# Patient Record
Sex: Female | Born: 2000 | Race: Black or African American | Hispanic: No | Marital: Single | Smoking: Never smoker
Health system: Southern US, Community
[De-identification: ages and names within clinical notes are randomized; demographics above are authoritative.]

## PROBLEM LIST (undated history)

## (undated) DIAGNOSIS — O24419 Gestational diabetes mellitus in pregnancy, unspecified control: Secondary | ICD-10-CM

---
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
## 2021-08-26 ENCOUNTER — Other Ambulatory Visit: Payer: Self-pay

## 2021-08-26 ENCOUNTER — Ambulatory Visit (INDEPENDENT_AMBULATORY_CARE_PROVIDER_SITE_OTHER): Payer: 59

## 2021-08-26 VITALS — BP 122/80 | HR 85 | Ht 66.25 in | Wt 233.5 lb

## 2021-08-26 DIAGNOSIS — Z3480 Encounter for supervision of other normal pregnancy, unspecified trimester: Secondary | ICD-10-CM | POA: Insufficient documentation

## 2021-08-26 DIAGNOSIS — Z3401 Encounter for supervision of normal first pregnancy, first trimester: Secondary | ICD-10-CM | POA: Diagnosis not present

## 2021-08-26 DIAGNOSIS — Z3A01 Less than 8 weeks gestation of pregnancy: Secondary | ICD-10-CM | POA: Diagnosis not present

## 2021-08-26 NOTE — Progress Notes (Signed)
Patient was assessed and managed by nursing staff during this encounter. I have reviewed the chart and agree with the documentation and plan. I have also made any necessary editorial changes.  Catalina Antigua, MD 08/26/2021 11:14 AM

## 2021-08-26 NOTE — Progress Notes (Addendum)
New OB Intake  I connected with  Janice Kemp on 08/26/21 at  8:15 AM EDT by in person. Video Visit and verified that I am speaking with the correct person using two identifiers. Nurse is located at [facility] and pt is located at [place].  I discussed the limitations, risks, security and privacy concerns of performing an evaluation and management service by telephone and the availability of in person appointments. I also discussed with the patient that there may be a patient responsible charge related to this service. The patient expressed understanding and agreed to proceed.  I explained I am completing New OB Intake today. We discussed her EDD of 04/23/22 that is based on early u/s. Pt is G1/P0. I reviewed her allergies, medications, Medical/Surgical/OB history, and appropriate screenings. I informed her of [facility] services. Based on history, this is a/an  pregnancy uncomplicated .   There are no problems to display for this patient.   Concerns addressed today  Delivery Plans:  Plans to deliver at [facility].   MyChart/Babyscripts MyChart access verified. I explained pt will have some visits in office and some virtually. Babyscripts instructions given and order placed. Patient verifies receipt of registration text/e-mail. Account successfully created and app downloaded.  Blood Pressure Cuff  Patient has private insurance; instructed to purchase blood pressure cuff and bring to first prenatal appt. Explained after first prenatal appt pt will check weekly and document in Babyscripts.  Weight scale: Patient    have weight scale. Weight scale ordered for patient to pick up form [facility].   Anatomy US Explained first scheduled [place] will be around 19 weeks. Dating and viability scan performed today.  Labs Discussed Avelina Laine genetic screening with patient. Would like both Panorama and Horizon drawn at new OB visit. Routine prenatal labs needed.  Covid Vaccine Patient has not covid vaccine.    Mother/ Baby Dyad Candidate?    If yes, offer as possibility  Informed patient of [facility] website  and placed link in her AVS.   Social Determinants of Health Food Insecurity: Patient denies food insecurity. WIC Referral: Patient is interested in referral to [facility].  Transportation: Patient denies transportation needs. Childcare: Discussed no children allowed at ultrasound appointments. Offered childcare services; patient declines childcare services at this time.  Send link to Pregnancy Navigators   Placed OB Box on problem list and updated  First visit review I reviewed new OB appt with pt. I explained she will have a pelvic exam, ob bloodwork with genetic screening, and PAP smear. Explained pt will be seen by Gerrit Heck at first visit; encounter routed to appropriate provider. Explained that patient will be seen by pregnancy navigator following visit with provider. [facility] information placed in AVS.   Hamilton Capri, RN 08/26/2021  8:27 AM

## 2021-09-05 ENCOUNTER — Other Ambulatory Visit: Payer: 59

## 2021-09-05 ENCOUNTER — Ambulatory Visit (INDEPENDENT_AMBULATORY_CARE_PROVIDER_SITE_OTHER): Payer: 59

## 2021-09-05 ENCOUNTER — Other Ambulatory Visit: Payer: Self-pay

## 2021-09-05 DIAGNOSIS — Z3401 Encounter for supervision of normal first pregnancy, first trimester: Secondary | ICD-10-CM

## 2021-09-05 DIAGNOSIS — Z3A01 Less than 8 weeks gestation of pregnancy: Secondary | ICD-10-CM

## 2021-09-05 DIAGNOSIS — O3680X0 Pregnancy with inconclusive fetal viability, not applicable or unspecified: Secondary | ICD-10-CM

## 2021-09-05 NOTE — Progress Notes (Signed)
DATING AND VIABILITY SONOGRAM   Janice Kemp is a 20 y.o. year old G1P0 with LMP Patient's last menstrual period was 06/30/2021. which would correlate to  [redacted]w[redacted]d weeks gestation.  She has regular menstrual cycles.   She is here today for a confirmatory initial sonogram.    GESTATION: SINGLETON Pregnancy     FETAL ACTIVITY:          Heart rate         147          The fetus is inactive.   GESTATIONAL AGE AND  BIOMETRICS:  Gestational criteria: Estimated Date of Delivery: 04/23/22 by early ultrasound now at [redacted]w[redacted]d  Previous Scans:1                                                  AVERAGE EGA(BY THIS SCAN):  7 weeks  WORKING EDD( early ultrasound ):  04/23/22     TECHNICIAN COMMENTS:  Single live IUP at [redacted]w[redacted]d. Reassessment of fetal heart rate due to fetal bradycardia at last scan.   A copy of this report including all images has been saved and backed up to a second source for retrieval if needed. All measures and details of the anatomical scan, placentation, fluid volume and pelvic anatomy are contained in that report.  Raliyah Montella J Jamesa Tedrick 09/05/2021 8:27 AM

## 2021-09-24 ENCOUNTER — Encounter: Payer: Self-pay | Admitting: Advanced Practice Midwife

## 2021-09-26 ENCOUNTER — Ambulatory Visit: Payer: Self-pay | Admitting: Obstetrics and Gynecology

## 2021-10-10 ENCOUNTER — Ambulatory Visit (INDEPENDENT_AMBULATORY_CARE_PROVIDER_SITE_OTHER): Payer: 59

## 2021-10-10 ENCOUNTER — Other Ambulatory Visit (HOSPITAL_COMMUNITY)
Admission: RE | Admit: 2021-10-10 | Discharge: 2021-10-10 | Disposition: A | Discharge status: Home / Self Care | Payer: 59 | Source: Ambulatory Visit

## 2021-10-10 ENCOUNTER — Other Ambulatory Visit: Payer: Self-pay

## 2021-10-10 VITALS — BP 130/81 | HR 90 | Wt 239.4 lb

## 2021-10-10 DIAGNOSIS — O99210 Obesity complicating pregnancy, unspecified trimester: Secondary | ICD-10-CM | POA: Diagnosis not present

## 2021-10-10 DIAGNOSIS — Z3401 Encounter for supervision of normal first pregnancy, first trimester: Secondary | ICD-10-CM

## 2021-10-10 DIAGNOSIS — Z124 Encounter for screening for malignant neoplasm of cervix: Secondary | ICD-10-CM

## 2021-10-10 DIAGNOSIS — Z3A12 12 weeks gestation of pregnancy: Secondary | ICD-10-CM | POA: Diagnosis not present

## 2021-10-10 MED ORDER — ASPIRIN EC 81 MG PO TBEC: 81.0000 mg | DELAYED_RELEASE_TABLET | Freq: Every day | ORAL | 4 refills | Status: DC

## 2021-10-10 NOTE — Progress Notes (Signed)
Subjective:   Janice Kemp is a 20 y.o. G1P0 at [redacted]w[redacted]d by 5 week early ultrasound being seen today for her first obstetrical visit.  Patient states this was an unplanned pregnancy.  Patient reports she not on birth control prior to conception, but had her Nexplanon removed in Feb 2022.  Gynecological/Obstetrical History: Patient reports no history of gynecological surgeries.  Patient has had a pap and denies history of abnormal pap smears.   Pregnancy history fully reviewed. Patient does intend to breast feed. Patient obstetrical history is significant for obesity.   Sexual Activity and Vaginal Concerns: Patient is currently sexually active and no pain or discomfort during intercourse.  She also denies vaginal bleeding, irritation, or odor. She reports some clear vaginal discharge, but denies issues. Patient also denies pain or difficulty with urination.    Medical History/ROS: Patient denies medical history significant for cardiovascular, respiratory, gastrointestinal, or hematological disorders. Patient also denies history of MH disorders including anxiety and/or depression.   Patient reports no complaints.  Patient reports constipation and reports last bm was yesterday that was not difficult to pass.  She denies diarrhea or nausea/vomiting.  No recurrent headaches, but reports occasional HA.    Social History: Patient report history of alcohol usage which subsided after discovery of pregnancy.  She denies current usage of tobacco, alcohol, or drugs.  Patient reports the FOB is George Ina who is present and supportive.  Patient reports that she lives with Loraine Leriche and endorses safety at home.  Patient denies DV/A. Patient is currently employed at [employer]. She reports she wears a mask while working.  HISTORY: OB History  Gravida Para Term Preterm AB Living  1 0 0 0 0 0  SAB IAB Ectopic Multiple Live Births  0 0 0 0 0    # Outcome Date GA Lbr Len/2nd Weight Sex Delivery Anes PTL  Lv  1 Current             Last pap smear was done today and is pending.  No past medical history on file. Past Surgical History:  Procedure Laterality Date   TONSILECTOMY/ADENOIDECTOMY WITH MYRINGOTOMY  2005   Family History  Problem Relation Age of Onset   Hypertension Mother    Hypertension Maternal Grandmother    Social History   Tobacco Use   Smoking status: Never   Smokeless tobacco: Never  Vaping Use   Vaping Use: Never used  Substance Use Topics   Alcohol use: Not Currently    Comment: not since confirmed pregnancy   Drug use: Not Currently    Types: Marijuana    Comment: not since confirmed pregnancy   Not on File Current Outpatient Medications on File Prior to Visit  Medication Sig Dispense Refill   Prenatal Vit-Fe Fumarate-FA (PRENATAL VITAMINS PO) Take by mouth.     No current facility-administered medications on file prior to visit.    Review of Systems Pertinent items noted in HPI and remainder of comprehensive ROS otherwise negative.  Exam   Vitals:   10/10/21 0820  BP: 130/81  Pulse: 90  Weight: 239 lb 6.4 oz (108.6 kg)      Physical Exam Vitals reviewed. Exam conducted with a chaperone present.  Constitutional:      Appearance: Normal appearance. She is obese.  HENT:     Head: Normocephalic and atraumatic.  Eyes:     Conjunctiva/sclera: Conjunctivae normal.  Cardiovascular:     Rate and Rhythm: Normal rate and regular rhythm.  Pulses: Normal pulses.     Heart sounds: Normal heart sounds.  Pulmonary:     Effort: Pulmonary effort is normal. No respiratory distress.     Breath sounds: Normal breath sounds.  Abdominal:     General: Bowel sounds are normal.     Palpations: Abdomen is soft.  Genitourinary:    General: Normal vulva.     Comments: Speculum Exam: -Normal External Genitalia: Non tender, no apparent discharge at introitus.  -Vaginal Vault: Pink mucosa with good rugae. Scant amt discharge   -Cervix:Pink, no lesions,  cysts, or polyps.  Appears closed. No active bleeding from os- Pap collected with brush and spatula -Bimanual Exam:  Uterine size/position difficult to appreciate d/t body habitus.  No tenderness.    Musculoskeletal:        General: Normal range of motion.     Cervical back: Normal range of motion.  Skin:    General: Skin is warm and dry.  Neurological:     Mental Status: She is alert and oriented to person, place, and time.  Psychiatric:        Mood and Affect: Mood normal.        Behavior: Behavior normal.        Thought Content: Thought content normal.     Assessment:   19 y.o. year old G1P0 Patient Active Problem List   Diagnosis Date Noted   Encounter for supervision of normal first pregnancy in first trimester 08/26/2021     Plan:  1. Encounter for supervision of normal first pregnancy in first trimester -Congratulations given and patient welcomed to practice. -Discussed usage of Babyscripts and virtual visits as additional source of managing and completing PN visits.   *Instructed to take blood pressure and record weekly into babyscripts. *Reviewed prenatal visit schedule and platforms used for virtual visits.  -Anticipatory guidance for prenatal visits including labs, ultrasounds, and testing; Initial labs ordered and drawn. -Genetic Screening discussed, First trimester screen, Quad screen, and NIPS:  ordered and discussed . -Encouraged to complete and utilize MyChart Registration for her ability to review results, send requests, and have questions addressed.  -Discussed estimated due date of April 23, 2022. -Ultrasound discussed; fetal anatomic survey: ordered. -Influenza offered and declined. Will consider at a future visit.  -Encouraged to seek out care at office or emergency room for urgent and/or emergent concerns. -Educated on the nature of [facility] with multiple MDs and other Advanced Practice Providers was explained to  patient; also emphasized that residents, students are part of our team. Informed of her right to refuse care as she deems appropriate.  -No questions or concerns.   2. Obesity in Pregnancy -Prepreg BMI 35, Current 38 -Baseline Labs collected. -HgB A1C ordered. -Educated on research and risk factors for PreEclampsia. -Discussed recommendation for initiation of bASA starting at 13 weeks. -Rx sent for daily usage.  Patient to start on Sunday Oct 23.  3. Papanicolaou smear -Exam performed and findings discussed. -Informed of ASCCP guidelines and handling of results. -Pap collected and pending.   4. [redacted] weeks gestation of pregnancy -Discussed next appt in person with AFP. -Reviewed how and when to contact office for pregnancy related concerns/emergencies.   Problem list reviewed and updated. Routine obstetric precautions reviewed.  No orders of the defined types were placed in this encounter.   No follow-ups on file.  Cherre Robins, CNM 10/10/2021 8:33 AM

## 2021-10-11 LAB — COMPREHENSIVE METABOLIC PANEL
ALT: 8 IU/L (ref 0–32)
AST: 12 IU/L (ref 0–40)
Albumin/Globulin Ratio: 1.5 (ref 1.2–2.2)
Albumin: 4.3 g/dL (ref 3.9–5.0)
Alkaline Phosphatase: 73 IU/L (ref 44–121)
BUN/Creatinine Ratio: 8 — ABNORMAL LOW (ref 9–23)
BUN: 6 mg/dL (ref 6–20)
Bilirubin Total: <0.2 mg/dL (ref 0.0–1.2)
CO2: 19 mmol/L — ABNORMAL LOW (ref 20–29)
Calcium: 9.9 mg/dL (ref 8.7–10.2)
Chloride: 101 mmol/L (ref 96–106)
Creatinine, Ser: 0.71 mg/dL (ref 0.57–1.00)
Globulin, Total: 2.9 g/dL (ref 1.5–4.5)
Glucose: 92 mg/dL (ref 70–99)
Potassium: 4.1 mmol/L (ref 3.5–5.2)
Sodium: 135 mmol/L (ref 134–144)
Total Protein: 7.2 g/dL (ref 6.0–8.5)
eGFR: 124 mL/min/{1.73_m2} (ref >59)

## 2021-10-11 LAB — OBSTETRIC PANEL, INCLUDING HIV
Antibody Screen: NEGATIVE
Basophils Absolute: 0.1 10*3/uL (ref 0.0–0.2)
Basos: 1 %
EOS (ABSOLUTE): 0.3 10*3/uL (ref 0.0–0.4)
Eos: 2 %
HIV Screen 4th Generation wRfx: NONREACTIVE
Hematocrit: 33.2 % — ABNORMAL LOW (ref 34.0–46.6)
Hemoglobin: 11.4 g/dL (ref 11.1–15.9)
Hepatitis B Surface Ag: NEGATIVE
Immature Grans (Abs): 0.1 10*3/uL (ref 0.0–0.1)
Immature Granulocytes: 1 %
Lymphocytes Absolute: 2.3 10*3/uL (ref 0.7–3.1)
Lymphs: 17 %
MCH: 29.2 pg (ref 26.6–33.0)
MCHC: 34.3 g/dL (ref 31.5–35.7)
MCV: 85 fL (ref 79–97)
Monocytes Absolute: 0.8 10*3/uL (ref 0.1–0.9)
Monocytes: 6 %
Neutrophils Absolute: 10.3 10*3/uL — ABNORMAL HIGH (ref 1.4–7.0)
Neutrophils: 73 %
Platelets: 376 10*3/uL (ref 150–450)
RBC: 3.91 x10E6/uL (ref 3.77–5.28)
RDW: 13.2 % (ref 11.7–15.4)
RPR Ser Ql: NONREACTIVE
Rh Factor: POSITIVE
Rubella Antibodies, IGG: 5.68 index (ref >0.99)
WBC: 13.9 10*3/uL — ABNORMAL HIGH (ref 3.4–10.8)

## 2021-10-11 LAB — HEMOGLOBIN A1C
Est. average glucose Bld gHb Est-mCnc: 94 mg/dL
Hgb A1c MFr Bld: 4.9 % (ref 4.8–5.6)

## 2021-10-11 LAB — HEPATITIS C ANTIBODY: Hep C Virus Ab: <0.1 s/co ratio (ref 0.0–0.9)

## 2021-10-12 DIAGNOSIS — O99210 Obesity complicating pregnancy, unspecified trimester: Secondary | ICD-10-CM | POA: Insufficient documentation

## 2021-10-12 LAB — URINE CULTURE, OB REFLEX

## 2021-10-12 LAB — CULTURE, OB URINE

## 2021-10-16 LAB — CYTOLOGY - PAP
Chlamydia: NEGATIVE
Comment: NEGATIVE
Comment: NEGATIVE
Comment: NEGATIVE
Comment: NORMAL
Diagnosis: UNDETERMINED — AB
High risk HPV: NEGATIVE
Neisseria Gonorrhea: NEGATIVE
Trichomonas: NEGATIVE

## 2021-11-14 ENCOUNTER — Ambulatory Visit (INDEPENDENT_AMBULATORY_CARE_PROVIDER_SITE_OTHER): Payer: 59

## 2021-11-14 ENCOUNTER — Other Ambulatory Visit: Payer: Self-pay

## 2021-11-14 VITALS — BP 128/78 | HR 88 | Wt 245.0 lb

## 2021-11-14 DIAGNOSIS — O99210 Obesity complicating pregnancy, unspecified trimester: Secondary | ICD-10-CM

## 2021-11-14 DIAGNOSIS — Z3402 Encounter for supervision of normal first pregnancy, second trimester: Secondary | ICD-10-CM

## 2021-11-14 DIAGNOSIS — Z3A17 17 weeks gestation of pregnancy: Secondary | ICD-10-CM

## 2021-11-14 NOTE — Progress Notes (Signed)
Pt is not taking daily ASA, says it "didn't agree with her" Pt declines AFP today.

## 2021-11-14 NOTE — Progress Notes (Signed)
   LOW-RISK PREGNANCY OFFICE VISIT  Patient name: Janice Kemp MRN [id number]  Date of birth: 08-14-2001 Chief Complaint:   Routine Prenatal Visit  Subjective:   Janice Kemp is a 20 y.o. G1P0 female at [redacted]w[redacted]d with an Estimated Date of Delivery: 04/23/22 being seen today for ongoing management of a low-risk pregnancy aeb has Encounter for supervision of normal first pregnancy in first trimester and Obesity in pregnancy on their problem list.  Patient presents today with no complaints.  Patient endorses fetal movement. Patient reports some intermittent abdominal cramping that occurs randomly, but usually at night. She reports intake of at least 10 bottles of water daily.  Patient denies vaginal concerns including abnormal discharge, leaking of fluid, and bleeding.  Contractions: Not present. Vag. Bleeding: None.  Movement: Present.  Patient questions if she should be taking iron and vit d supplement.  Also would like to know if she can continue elderberry.  Patient states she has not been taking her bASA because it causes nausea.   Reviewed past medical,surgical, social, obstetrical and family history as well as problem list, medications and allergies.  Objective   Vitals:   11/14/21 0838  BP: 128/78  Pulse: 88  Weight: 245 lb (111.1 kg)  Body mass index is 39.25 kg/m.  Total Weight Gain:25 lb (11.3 kg)         Physical Examination:   General appearance: Well appearing, and in no distress  Mental status: Alert, oriented to person, place, and time  Skin: Warm & dry  Cardiovascular: Normal heart rate noted  Respiratory: Normal respiratory effort, no distress  Abdomen: Soft, gravid, nontender, Fundus not assessed  Pelvic: Cervical exam deferred           Extremities:    Fetal Status: Fetal Heart Rate (bpm): 150  Movement: Present   No results found for this or any previous visit (from the past 24 hour(s)).  Assessment & Plan:  Low-risk pregnancy of a 20 y.o., G1P0 at [redacted]w[redacted]d  with an Estimated Date of Delivery: 04/23/22   1. Encounter for supervision of normal first pregnancy in second trimester -Anticipatory guidance for upcoming appts. -Patient to schedule next appt in 5 weeks for an in-person visit. -Reviewed previous labs and results including ASCUS pap. -Patient unaware of fetal sex! Informed that LR Nips and negative GS.  -Encouraged enrollment and completion of childbirth class. Reviewed availability of classes at [email or web address]  2. [redacted] weeks gestation of pregnancy -Doing well. -Questions appropriate of supplements; iron, elderberry, and vit d. -Informed that iron is present in PNV and HgB normal. -Discussed signs of low vitamin d and when to report.  -Okay for elderberry usage.  3. Obesity in pregnancy -Instructed to try to take bASA at bedtime and not with PNV. -Patient reports she is aware of reasoning for bASA usage in pregnancy.  -TWG 225lbs with consideration for prepregnancy weight.  13 lbs since 5.5 weeks.     Meds: No orders of the defined types were placed in this encounter.  Labs/procedures today:  Lab Orders  No laboratory test(s) ordered today     Reviewed: Preterm labor symptoms and general obstetric precautions including but not limited to vaginal bleeding, contractions, leaking of fluid and fetal movement were reviewed in detail with the patient.  All questions were answered.  Follow-up: Return in about 5 weeks (around 12/19/2021) for LROB.  No orders of the defined types were placed in this encounter.  Cherre Robins MSN, CNM 11/14/2021 -

## 2021-11-27 ENCOUNTER — Other Ambulatory Visit: Payer: Self-pay | Admitting: *Deleted

## 2021-11-27 ENCOUNTER — Encounter: Payer: Self-pay | Admitting: *Deleted

## 2021-11-27 ENCOUNTER — Other Ambulatory Visit: Payer: Self-pay

## 2021-11-27 ENCOUNTER — Ambulatory Visit: Payer: 59 | Admitting: *Deleted

## 2021-11-27 ENCOUNTER — Ambulatory Visit: Payer: 59

## 2021-11-27 VITALS — BP 120/66 | HR 91

## 2021-11-27 DIAGNOSIS — Z362 Encounter for other antenatal screening follow-up: Secondary | ICD-10-CM

## 2021-11-27 DIAGNOSIS — Z3401 Encounter for supervision of normal first pregnancy, first trimester: Secondary | ICD-10-CM | POA: Diagnosis present

## 2021-11-27 DIAGNOSIS — O99212 Obesity complicating pregnancy, second trimester: Secondary | ICD-10-CM

## 2021-11-27 DIAGNOSIS — O358XX0 Maternal care for other (suspected) fetal abnormality and damage, not applicable or unspecified: Secondary | ICD-10-CM | POA: Diagnosis not present

## 2021-11-27 DIAGNOSIS — O99210 Obesity complicating pregnancy, unspecified trimester: Secondary | ICD-10-CM | POA: Diagnosis present

## 2021-11-27 DIAGNOSIS — Z3A19 19 weeks gestation of pregnancy: Secondary | ICD-10-CM | POA: Insufficient documentation

## 2021-11-29 DIAGNOSIS — O283 Abnormal ultrasonic finding on antenatal screening of mother: Secondary | ICD-10-CM | POA: Insufficient documentation

## 2021-12-16 ENCOUNTER — Ambulatory Visit (INDEPENDENT_AMBULATORY_CARE_PROVIDER_SITE_OTHER): Payer: 59 | Admitting: Obstetrics and Gynecology

## 2021-12-16 ENCOUNTER — Other Ambulatory Visit: Payer: Self-pay

## 2021-12-16 ENCOUNTER — Encounter: Payer: Self-pay | Admitting: Obstetrics and Gynecology

## 2021-12-16 VITALS — BP 123/82 | HR 103 | Wt 249.4 lb

## 2021-12-16 DIAGNOSIS — O99210 Obesity complicating pregnancy, unspecified trimester: Secondary | ICD-10-CM

## 2021-12-16 DIAGNOSIS — Z3401 Encounter for supervision of normal first pregnancy, first trimester: Secondary | ICD-10-CM

## 2021-12-16 DIAGNOSIS — O283 Abnormal ultrasonic finding on antenatal screening of mother: Secondary | ICD-10-CM

## 2021-12-16 NOTE — Patient Instructions (Signed)

## 2021-12-16 NOTE — Progress Notes (Signed)
Subjective:  Janice Kemp is a 20 y.o. G1P0 at [redacted]w[redacted]d being seen today for ongoing prenatal care.  She is currently monitored for the following issues for this low-risk pregnancy and has Encounter for supervision of normal first pregnancy in first trimester; Obesity in pregnancy; and Echogenic intracardiac focus of fetus on prenatal ultrasound on their problem list.  Patient reports no complaints.  Contractions: Not present. Vag. Bleeding: None.  Movement: Present. Denies leaking of fluid.   The following portions of the patient's history were reviewed and updated as appropriate: allergies, current medications, past family history, past medical history, past social history, past surgical history and problem list. Problem list updated.  Objective:   Vitals:   12/16/21 0945  BP: 123/82  Pulse: (!) 103  Weight: 249 lb 6.4 oz (113.1 kg)    Fetal Status: Fetal Heart Rate (bpm): 155   Movement: Present     General:  Alert, oriented and cooperative. Patient is in no acute distress.  Skin: Skin is warm and dry. No rash noted.   Cardiovascular: Normal heart rate noted  Respiratory: Normal respiratory effort, no problems with respiration noted  Abdomen: Soft, gravid, appropriate for gestational age. Pain/Pressure: Absent     Pelvic:  Cervical exam deferred        Extremities: Normal range of motion.  Edema: None  Mental Status: Normal mood and affect. Normal behavior. Normal judgment and thought content.   Urinalysis:      Assessment and Plan:  Pregnancy: G1P0 at [redacted]w[redacted]d  1. Encounter for supervision of normal first pregnancy in first trimester Stable Declined AFP  2. Echogenic intracardiac focus of fetus on prenatal ultrasound F/U U/S scheduled  3. Obesity in pregnancy Stable Continue with qd BASA  Preterm labor symptoms and general obstetric precautions including but not limited to vaginal bleeding, contractions, leaking of fluid and fetal movement were reviewed in detail with the  patient. Please refer to After Visit Summary for other counseling recommendations.  No follow-ups on file.   Hermina Staggers, MD

## 2021-12-16 NOTE — Progress Notes (Signed)
Patient presents for ROB. Patients has no concerns today. Patient declines AFP open.

## 2021-12-26 ENCOUNTER — Other Ambulatory Visit: Payer: Self-pay

## 2021-12-26 ENCOUNTER — Other Ambulatory Visit: Payer: Self-pay | Admitting: *Deleted

## 2021-12-26 ENCOUNTER — Ambulatory Visit: Payer: 59 | Attending: Maternal & Fetal Medicine

## 2021-12-26 ENCOUNTER — Ambulatory Visit: Payer: 59 | Admitting: *Deleted

## 2021-12-26 VITALS — BP 116/66 | HR 97

## 2021-12-26 DIAGNOSIS — Z3401 Encounter for supervision of normal first pregnancy, first trimester: Secondary | ICD-10-CM | POA: Insufficient documentation

## 2021-12-26 DIAGNOSIS — Z3A23 23 weeks gestation of pregnancy: Secondary | ICD-10-CM | POA: Diagnosis not present

## 2021-12-26 DIAGNOSIS — Z3689 Encounter for other specified antenatal screening: Secondary | ICD-10-CM

## 2021-12-26 DIAGNOSIS — Z362 Encounter for other antenatal screening follow-up: Secondary | ICD-10-CM | POA: Insufficient documentation

## 2021-12-26 DIAGNOSIS — E669 Obesity, unspecified: Secondary | ICD-10-CM | POA: Diagnosis not present

## 2021-12-26 DIAGNOSIS — O283 Abnormal ultrasonic finding on antenatal screening of mother: Secondary | ICD-10-CM | POA: Diagnosis present

## 2021-12-26 DIAGNOSIS — O99212 Obesity complicating pregnancy, second trimester: Secondary | ICD-10-CM | POA: Insufficient documentation

## 2021-12-26 DIAGNOSIS — O358XX0 Maternal care for other (suspected) fetal abnormality and damage, not applicable or unspecified: Secondary | ICD-10-CM | POA: Diagnosis not present

## 2021-12-26 DIAGNOSIS — O35EXX0 Maternal care for other (suspected) fetal abnormality and damage, fetal genitourinary anomalies, not applicable or unspecified: Secondary | ICD-10-CM

## 2021-12-29 NOTE — L&D Delivery Note (Signed)
OB/GYN Faculty Practice Delivery Note ? ?Janice Kemp is a 21 y.o. G1P1001 s/p SVD at [redacted]w[redacted]d. She was admitted for SROM and SOL.  ? ?ROM: 23h 2m with clear fluid ?GBS Status: Negative ?Maximum Maternal Temperature: 100.8 near delivery; received Amp/Gent x1 ? ?Delivery Date/Time: 04/14/22 at 0310 ? ?Delivery: Called to room and patient was complete and pushing. Head delivered direct OA. No nuchal cord present. Compound hand noted. Rotational maneuver performed to allow for delivery and shoulders and body. Infant with spontaneous cry, placed on mother's abdomen, dried and stimulated. Cord clamped x 2 after 1-minute delay and cut by FOB under my direct supervision. Cord blood drawn. Placenta delivered spontaneously with gentle cord traction. Fundus firm with massage and Pitocin. TXA given. Labia, perineum, vagina, and cervix were inspected, and no lacerations were noted. Patient had a steady trickle of bleeding after delivery. Lower uterine sweep performed, small clots removed. Mildly boggy lower uterine segment. Methergine x1 given with improvement in tone and bleeding. ? ?Placenta: Intact, 3VC - sent to L&D  ?Complications: None  ?Lacerations: None  ?EBL: 150 cc ?Analgesia: Epidural  ? ?Infant: Viable female  APGARs 7 and 9  ? ?Evalina Field, MD ?OB/GYN Fellow, Faculty Practice  ? ? ?

## 2022-01-02 ENCOUNTER — Telehealth: Payer: Self-pay

## 2022-01-02 NOTE — Telephone Encounter (Signed)
Written for Breast Pump signed and fax to [employer] on 01/02/2022

## 2022-01-13 ENCOUNTER — Ambulatory Visit (INDEPENDENT_AMBULATORY_CARE_PROVIDER_SITE_OTHER): Payer: 59 | Admitting: Advanced Practice Midwife

## 2022-01-13 ENCOUNTER — Encounter: Payer: Self-pay | Admitting: Advanced Practice Midwife

## 2022-01-13 ENCOUNTER — Other Ambulatory Visit: Payer: Self-pay

## 2022-01-13 VITALS — BP 131/84 | HR 94 | Wt 248.0 lb

## 2022-01-13 DIAGNOSIS — Z3A25 25 weeks gestation of pregnancy: Secondary | ICD-10-CM

## 2022-01-13 DIAGNOSIS — Z3480 Encounter for supervision of other normal pregnancy, unspecified trimester: Secondary | ICD-10-CM

## 2022-01-13 NOTE — Progress Notes (Signed)
° °  PRENATAL VISIT NOTE  Subjective:  Janice Kemp is a 21 y.o. G1P0 at [redacted]w[redacted]d being seen today for ongoing prenatal care.  She is currently monitored for the following issues for this low-risk pregnancy and has Supervision of other normal pregnancy, antepartum; Obesity in pregnancy; and Echogenic intracardiac focus of fetus on prenatal ultrasound on their problem list.  Patient reports no complaints.  Contractions: Not present. Vag. Bleeding: None.  Movement: Present. Denies leaking of fluid.   The following portions of the patient's history were reviewed and updated as appropriate: allergies, current medications, past family history, past medical history, past social history, past surgical history and problem list.   Objective:   Vitals:   01/13/22 0950  BP: 131/84  Pulse: 94  Weight: 248 lb (112.5 kg)    Fetal Status: Fetal Heart Rate (bpm): 150 Fundal Height: 25 cm Movement: Present     General:  Alert, oriented and cooperative. Patient is in no acute distress.  Skin: Skin is warm and dry. No rash noted.   Cardiovascular: Normal heart rate noted  Respiratory: Normal respiratory effort, no problems with respiration noted  Abdomen: Soft, gravid, appropriate for gestational age.  Pain/Pressure: Absent     Pelvic: Cervical exam deferred        Extremities: Normal range of motion.  Edema: None  Mental Status: Normal mood and affect. Normal behavior. Normal judgment and thought content.   Assessment and Plan:  Pregnancy: G1P0 at [redacted]w[redacted]d 1. Encounter for supervision of normal first pregnancy in first trimester --Anticipatory guidance about next visits/weeks of pregnancy given. --Next appt in 3 weeks for GTT  2. [redacted] weeks gestation of pregnancy   Preterm labor symptoms and general obstetric precautions including but not limited to vaginal bleeding, contractions, leaking of fluid and fetal movement were reviewed in detail with the patient. Please refer to After Visit Summary for  other counseling recommendations.   Return in about 3 weeks (around 02/03/2022).  Future Appointments  Date Time Provider [place]  02/03/2022  8:00 AM [facility] LAB [facility] None  02/04/2022  8:35 AM Shelly Bombard, MD [facility] None  02/27/2022 10:45 AM [facility] NURSE [facility]  02/27/2022 11:00 AM [facility] US1 [facility]    Fatima Blank, CNM

## 2022-02-03 ENCOUNTER — Other Ambulatory Visit: Payer: 59

## 2022-02-04 ENCOUNTER — Other Ambulatory Visit: Payer: Self-pay

## 2022-02-04 ENCOUNTER — Encounter: Payer: Self-pay | Admitting: Obstetrics

## 2022-02-04 ENCOUNTER — Other Ambulatory Visit: Payer: 59

## 2022-02-04 ENCOUNTER — Ambulatory Visit (INDEPENDENT_AMBULATORY_CARE_PROVIDER_SITE_OTHER): Payer: 59 | Admitting: Obstetrics

## 2022-02-04 VITALS — BP 131/83 | HR 113 | Wt 255.5 lb

## 2022-02-04 DIAGNOSIS — Z3480 Encounter for supervision of other normal pregnancy, unspecified trimester: Secondary | ICD-10-CM

## 2022-02-04 DIAGNOSIS — Z3A28 28 weeks gestation of pregnancy: Secondary | ICD-10-CM

## 2022-02-04 NOTE — Progress Notes (Signed)
Patient presents for ROB and GTT. Patient would like to get TDAP at next visit. Patient has no concerns today.

## 2022-02-04 NOTE — Progress Notes (Signed)
Subjective:  Janice Kemp is a 21 y.o. G1P0 at [redacted]w[redacted]d being seen today for ongoing prenatal care.  She is currently monitored for the following issues for this low-risk pregnancy and has Supervision of other normal pregnancy, antepartum; Obesity in pregnancy; and Echogenic intracardiac focus of fetus on prenatal ultrasound on their problem list.  Patient reports no complaints.  Contractions: Not present. Vag. Bleeding: None.  Movement: Present. Denies leaking of fluid.   The following portions of the patient's history were reviewed and updated as appropriate: allergies, current medications, past family history, past medical history, past social history, past surgical history and problem list. Problem list updated.  Objective:   Vitals:   02/04/22 0836  BP: 131/83  Pulse: (!) 113  Weight: 255 lb 8 oz (115.9 kg)    Fetal Status: Fetal Heart Rate (bpm): 145   Movement: Present     General:  Alert, oriented and cooperative. Patient is in no acute distress.  Skin: Skin is warm and dry. No rash noted.   Cardiovascular: Normal heart rate noted  Respiratory: Normal respiratory effort, no problems with respiration noted  Abdomen: Soft, gravid, appropriate for gestational age. Pain/Pressure: Absent     Pelvic:  Cervical exam deferred        Extremities: Normal range of motion.  Edema: None  Mental Status: Normal mood and affect. Normal behavior. Normal judgment and thought content.   Urinalysis:      Assessment and Plan:  Pregnancy: G1P0 at [redacted]w[redacted]d  1. Supervision of other normal pregnancy, antepartum Rx: - Glucose Tolerance, 2 Hours w/1 Hour - CBC - HIV Antibody (routine testing w rflx) - RPR   Preterm labor symptoms and general obstetric precautions including but not limited to vaginal bleeding, contractions, leaking of fluid and fetal movement were reviewed in detail with the patient. Please refer to After Visit Summary for other counseling recommendations.   Return in about 2  weeks (around 02/18/2022) for ROB.   Brock Bad, MD  02/04/22

## 2022-02-05 ENCOUNTER — Ambulatory Visit: Payer: 59

## 2022-02-05 ENCOUNTER — Other Ambulatory Visit: Payer: Self-pay | Admitting: Obstetrics

## 2022-02-05 DIAGNOSIS — O24410 Gestational diabetes mellitus in pregnancy, diet controlled: Secondary | ICD-10-CM

## 2022-02-05 DIAGNOSIS — O99013 Anemia complicating pregnancy, third trimester: Secondary | ICD-10-CM

## 2022-02-05 LAB — RPR: RPR Ser Ql: NONREACTIVE

## 2022-02-05 LAB — CBC
Hematocrit: 32.7 % — ABNORMAL LOW (ref 34.0–46.6)
Hemoglobin: 10.7 g/dL — ABNORMAL LOW (ref 11.1–15.9)
MCH: 27.9 pg (ref 26.6–33.0)
MCHC: 32.7 g/dL (ref 31.5–35.7)
MCV: 85 fL (ref 79–97)
Platelets: 324 10*3/uL (ref 150–450)
RBC: 3.83 x10E6/uL (ref 3.77–5.28)
RDW: 13.4 % (ref 11.7–15.4)
WBC: 13.8 10*3/uL — ABNORMAL HIGH (ref 3.4–10.8)

## 2022-02-05 LAB — HIV ANTIBODY (ROUTINE TESTING W REFLEX): HIV Screen 4th Generation wRfx: NONREACTIVE

## 2022-02-05 LAB — GLUCOSE TOLERANCE, 2 HOURS W/ 1HR
Glucose, 1 hour: 180 mg/dL — ABNORMAL HIGH (ref 70–179)
Glucose, 2 hour: 114 mg/dL (ref 70–152)
Glucose, Fasting: 76 mg/dL (ref 70–91)

## 2022-02-05 MED ORDER — IRON POLYSACCH CMPLX-B12-FA 150-0.025-1 MG PO CAPS
1.0000 | ORAL_CAPSULE | ORAL | 5 refills | Status: AC
Start: 2022-02-05 — End: ?

## 2022-02-07 ENCOUNTER — Other Ambulatory Visit: Payer: Self-pay

## 2022-02-07 ENCOUNTER — Telehealth: Payer: Self-pay

## 2022-02-07 MED ORDER — ACCU-CHEK GUIDE W/DEVICE KIT: 1.0000 | PACK | Freq: Four times a day (QID) | 0 refills | Status: DC

## 2022-02-07 MED ORDER — ACCU-CHEK GUIDE VI STRP: ORAL_STRIP | 12 refills | Status: DC

## 2022-02-07 MED ORDER — ACCU-CHEK SOFTCLIX LANCETS MISC: 1.0000 | Freq: Four times a day (QID) | 12 refills | Status: DC

## 2022-02-07 NOTE — Telephone Encounter (Signed)
S/w pt and advised of results and rx, glucose supplies sent to pharmacy

## 2022-02-12 ENCOUNTER — Encounter (HOSPITAL_COMMUNITY): Payer: Self-pay | Admitting: Obstetrics & Gynecology

## 2022-02-12 ENCOUNTER — Inpatient Hospital Stay (HOSPITAL_COMMUNITY)
Admission: AD | Admit: 2022-02-12 | Discharge: 2022-02-12 | Disposition: A | Discharge status: Home / Self Care | Payer: 59 | Attending: Obstetrics & Gynecology | Admitting: Obstetrics & Gynecology

## 2022-02-12 DIAGNOSIS — R1031 Right lower quadrant pain: Secondary | ICD-10-CM | POA: Diagnosis not present

## 2022-02-12 DIAGNOSIS — Z3A30 30 weeks gestation of pregnancy: Secondary | ICD-10-CM | POA: Insufficient documentation

## 2022-02-12 DIAGNOSIS — R102 Pelvic and perineal pain: Secondary | ICD-10-CM | POA: Diagnosis not present

## 2022-02-12 DIAGNOSIS — R1032 Left lower quadrant pain: Secondary | ICD-10-CM | POA: Insufficient documentation

## 2022-02-12 DIAGNOSIS — Z3689 Encounter for other specified antenatal screening: Secondary | ICD-10-CM

## 2022-02-12 DIAGNOSIS — O26893 Other specified pregnancy related conditions, third trimester: Secondary | ICD-10-CM | POA: Insufficient documentation

## 2022-02-12 DIAGNOSIS — R109 Unspecified abdominal pain: Secondary | ICD-10-CM | POA: Diagnosis present

## 2022-02-12 HISTORY — DX: Gestational diabetes mellitus in pregnancy, unspecified control: O24.419

## 2022-02-12 LAB — URINALYSIS, ROUTINE W REFLEX MICROSCOPIC
Bilirubin Urine: NEGATIVE
Glucose, UA: NEGATIVE mg/dL
Hgb urine dipstick: NEGATIVE
Ketones, ur: 20 mg/dL — AB
Leukocytes,Ua: NEGATIVE
Nitrite: NEGATIVE
Protein, ur: NEGATIVE mg/dL
Specific Gravity, Urine: 1.018 (ref 1.005–1.030)
pH: 7 (ref 5.0–8.0)

## 2022-02-12 LAB — WET PREP, GENITAL
Clue Cells Wet Prep HPF POC: NONE SEEN
Sperm: NONE SEEN
Trich, Wet Prep: NONE SEEN
WBC, Wet Prep HPF POC: >=10 — AB (ref <10)
Yeast Wet Prep HPF POC: NONE SEEN

## 2022-02-12 NOTE — MAU Note (Addendum)
Pain in lower abd, started last night.  Pain is constant.  Did not take anything for it.  Denies bleeding or LOF.  Has been constipated, though did have a bm before leaving home.   Pain seems to get worse when she walks

## 2022-02-12 NOTE — Discharge Instructions (Signed)

## 2022-02-12 NOTE — MAU Provider Note (Signed)
History     CSN: [id number]  Arrival date and time: 02/12/22 1414   Event Date/Time   First Provider Initiated Contact with Patient 02/12/22 1452      Chief Complaint  Patient presents with   Abdominal Pain   HPI Janice Kemp is a 21 y.o. G1P0 at [redacted]w[redacted]d who presents to MAU with chief complaint of bilateral lower "abdominal pain". On initial CNM assessment patient clarifies that the pain is at her pelvis. Pain is bilateral and radiates into her upper thights. Pain score is 7/10. Patient prefers to avoid medication and so has not taken medication for this complaint. She denies aggravating or alleviating factors. She denies vaginal bleeding, leaking of fluid, decreased fetal movement, fever, falls, or recent illness.    OB History     Gravida  1   Para      Term      Preterm      AB      Living         SAB      IAB      Ectopic      Multiple      Live Births              Past Medical History:  Diagnosis Date   Gestational diabetes     Past Surgical History:  Procedure Laterality Date   TONSILECTOMY/ADENOIDECTOMY WITH MYRINGOTOMY  2005    Family History  Problem Relation Age of Onset   Hypertension Mother    Hypertension Maternal Grandmother     Social History   Tobacco Use   Smoking status: Never   Smokeless tobacco: Never  Vaping Use   Vaping Use: Never used  Substance Use Topics   Alcohol use: Not Currently    Comment: not since confirmed pregnancy   Drug use: Not Currently    Types: Marijuana    Comment: not since confirmed pregnancy    Allergies: No Known Allergies  No medications prior to admission.    Review of Systems  Gastrointestinal:  Positive for abdominal pain.  Genitourinary:  Positive for pelvic pain.  All other systems reviewed and are negative. Physical Exam   Blood pressure 118/72, pulse (!) 102, temperature 99 F (37.2 C), temperature source Oral, resp. rate 17, height 5\' 6"  (1.676 m), weight 113.2 kg, last  menstrual period 06/30/2021, SpO2 99 %.  Physical Exam Vitals and nursing note reviewed. Exam conducted with a chaperone present.  Constitutional:      Appearance: She is well-developed. She is obese. She is not ill-appearing.  Cardiovascular:     Rate and Rhythm: Normal rate and regular rhythm.     Heart sounds: Normal heart sounds.  Pulmonary:     Effort: Pulmonary effort is normal.     Breath sounds: Normal breath sounds.  Abdominal:     Palpations: Abdomen is soft.     Tenderness: There is no abdominal tenderness.     Hernia: No hernia is present.     Comments: Gravid  Skin:    Capillary Refill: Capillary refill takes less than 2 seconds.  Neurological:     Mental Status: She is alert and oriented to person, place, and time.  Psychiatric:        Mood and Affect: Mood normal.        Behavior: Behavior normal.    MAU Course  Procedures  MDM  --Reactive tracing: baseline 140, mod var, + accels, no dexels --Toco: UI, then quiet --Patient declines speculum  exam but verbally consents to digital exam. Closed/thick/posterior on SVE --Pain medication declined by patient --Pain score improved from 7/10 to 3/10 without intervention --Counseled on pelvic pain in pregnancy and round ligament pain. Advised non-medication based interventions including maternity belt, warm deep bath, gentle stretching  Patient Vitals for the past 24 hrs:  BP Temp Temp src Pulse Resp SpO2 Height Weight  02/12/22 1539 118/72 -- -- (!) 102 -- -- -- --  02/12/22 1445 -- -- -- -- -- 99 % -- --  02/12/22 1443 119/74 99 F (37.2 C) Oral (!) 118 17 -- -- --  02/12/22 1428 -- -- -- -- -- -- 5\' 6"  (1.676 m) 113.2 kg    Orders Placed This Encounter  Procedures   Wet prep, genital   Urinalysis, Routine w reflex microscopic Urine, Clean Catch   Discharge patient   Results for orders placed or performed during the hospital encounter of 02/12/22 (from the past 24 hour(s))  Urinalysis, Routine w reflex  microscopic Urine, Clean Catch     Status: Abnormal   Collection Time: 02/12/22  2:55 PM  Result Value Ref Range   Color, Urine YELLOW YELLOW   APPearance HAZY (A) CLEAR   Specific Gravity, Urine 1.018 1.005 - 1.030   pH 7.0 5.0 - 8.0   Glucose, UA NEGATIVE NEGATIVE mg/dL   Hgb urine dipstick NEGATIVE NEGATIVE   Bilirubin Urine NEGATIVE NEGATIVE   Ketones, ur 20 (A) NEGATIVE mg/dL   Protein, ur NEGATIVE NEGATIVE mg/dL   Nitrite NEGATIVE NEGATIVE   Leukocytes,Ua NEGATIVE NEGATIVE  Wet prep, genital     Status: Abnormal   Collection Time: 02/12/22  2:55 PM   Specimen: Vaginal  Result Value Ref Range   Yeast Wet Prep HPF POC NONE SEEN NONE SEEN   Trich, Wet Prep NONE SEEN NONE SEEN   Clue Cells Wet Prep HPF POC NONE SEEN NONE SEEN   WBC, Wet Prep HPF POC >=10 (A) <10   Sperm NONE SEEN    Assessment and Plan  --21 y.o. G1P0 at [redacted]w[redacted]d  --Reactive tracing --Closed cervix --Pelvic pain, round ligament pain in third trimester --Pain medication declined by patient --Discharge home in stable condition  Darlina Rumpf, CNM 02/12/2022, 6:03 PM

## 2022-02-13 LAB — GC/CHLAMYDIA PROBE AMP (~~LOC~~) NOT AT ARMC
Chlamydia: NEGATIVE
Comment: NEGATIVE
Comment: NORMAL
Neisseria Gonorrhea: NEGATIVE

## 2022-02-20 ENCOUNTER — Encounter: Payer: 59 | Attending: Obstetrics | Admitting: Registered"

## 2022-02-20 ENCOUNTER — Other Ambulatory Visit: Payer: Self-pay

## 2022-02-20 ENCOUNTER — Ambulatory Visit (INDEPENDENT_AMBULATORY_CARE_PROVIDER_SITE_OTHER): Payer: 59 | Admitting: Medical

## 2022-02-20 ENCOUNTER — Encounter: Payer: Self-pay | Admitting: Medical

## 2022-02-20 ENCOUNTER — Ambulatory Visit (INDEPENDENT_AMBULATORY_CARE_PROVIDER_SITE_OTHER): Payer: 59 | Admitting: Registered"

## 2022-02-20 VITALS — BP 122/75 | HR 107 | Wt 249.0 lb

## 2022-02-20 DIAGNOSIS — Z3480 Encounter for supervision of other normal pregnancy, unspecified trimester: Secondary | ICD-10-CM

## 2022-02-20 DIAGNOSIS — Z713 Dietary counseling and surveillance: Secondary | ICD-10-CM | POA: Insufficient documentation

## 2022-02-20 DIAGNOSIS — Z3A00 Weeks of gestation of pregnancy not specified: Secondary | ICD-10-CM | POA: Insufficient documentation

## 2022-02-20 DIAGNOSIS — O24410 Gestational diabetes mellitus in pregnancy, diet controlled: Secondary | ICD-10-CM | POA: Diagnosis not present

## 2022-02-20 DIAGNOSIS — O283 Abnormal ultrasonic finding on antenatal screening of mother: Secondary | ICD-10-CM

## 2022-02-20 DIAGNOSIS — Z3A31 31 weeks gestation of pregnancy: Secondary | ICD-10-CM

## 2022-02-20 DIAGNOSIS — Z23 Encounter for immunization: Secondary | ICD-10-CM | POA: Diagnosis not present

## 2022-02-20 DIAGNOSIS — O24419 Gestational diabetes mellitus in pregnancy, unspecified control: Secondary | ICD-10-CM

## 2022-02-20 DIAGNOSIS — O99210 Obesity complicating pregnancy, unspecified trimester: Secondary | ICD-10-CM

## 2022-02-20 NOTE — Patient Instructions (Signed)
AREA PEDIATRIC/FAMILY PRACTICE PHYSICIANS  [place] ([address]) [facility] Chambliss, MD; Eniola, MD; Hale, MD; Hensel, MD; McDiarmid, MD; McIntyer, MD; Neal, MD; Walden, MD [address] [place], [place] [address] [phone] Mon-Fri 8:30-12:30, 1:30-5:00 Providers come to see babies at [facility] Accepting Medicaid [facility] providers who accept newborns: Koirala, MD; Morrow, MD; Wolters, MD [address], [place], [place] [address] [phone] Mon-Fri 8:00-5:30 Babies seen by providers at [facility] Does NOT accept Medicaid Please call early in hospitalization for appointment (limited availability)  [facility] Mulberry, MD [address] [place], [place] [address] [phone] Mon, Tue, Thur, Fri 8:30-5:00, Wed 10:00-7:00 (closed 1-2pm) Babies seen by [facility] providers Accepting Medicaid Rubin - Pediatrician Rubin, MD [address], [place], [place] [address] [phone] Mon-Fri 8:30-5:00, Sat 8:30-12:00 Provider comes to see babies at [facility] Accepting Medicaid Must have been referred from current patients or contacted office prior to delivery [facility] ([facility]) Brown, MD; Chandler, MD; Ettefagh, MD; Grant, MD; Lester, MD; McCormick, MD; McQueen, MD; Prose, MD; Simha, MD; Stanley, MD; Stryffeler, NP; Tebben, NP [address], [place], [place] [address] [phone] Mon, Tue, Thur, Fri 8:30-5:30, Wed 9:30-5:30, Sat 8:30-12:30 Babies seen by [facility] providers Accepting Medicaid Only accepting infants of first-time parents or siblings of current patients Hospital discharge coordinator will make follow-up appointment Jack Amos [address], [place], [place]  [address] [phone]   Fax - [phone] [facility] [address], [place], [place]  [address] Phone - [phone]   Fax - [phone] Shilpa Gosrani [address], [place], [place]  [address] [phone]  [place] ([address]) [facility] Bates, MD; Brassfield, MD; Cooper, Cox, MD; MD; Davis, MD; Dovico, MD; Ettefaugh, MD; Little, MD; Lowe, MD; Keiffer, MD; Melvin, MD; Sumner, MD; Williams, MD [address], [place], [place] [address] [phone] Mon-Fri 8:30-5:00 (extended evenings Mon-Thur as needed), Sat-Sun 10:00-1:00 Providers come to see babies at [facility] Accepting Medicaid for families of first-time babies and families with all children in the household age 3 and under. Must register with office prior to making appointment (M-F only). [facility] Henson, NP; Knapp, MD; Lalonde, MD; Tysinger, PA [address] [place], [place] [address] [phone] Mon-Fri 8:00-5:00 Babies seen by providers at [facility] Does NOT accept Medicaid/Commercial Insurance Only [facility] ([facility])  Artis, MD; Barnes, MD; Bratton, MD; Coccaro, MD; [place] Gardner, MD; Kramer, MD; Marshall, MD; Netherton, MD; Poleto, MD; Skinner, MD [address] [place], [place] [address] [phone] Mon-Fri 8:30-5:30, Sat (Oct.-Mar.) 9:00-1:00 Babies seen by providers at [facility] Accepting Medicaid  [place] ([address]) [facility] Reid, MD; Warner, MD [address], [place],  [address] [phone] Mon-Fri 8:30-5:00, Sat 8:30-12:00 Providers come to see babies at [facility] Does NOT accept Medicaid [facility] Becker, PA; Hagler, MD; Scifres, PA; Sun, MD; Swayne, MD [address], ,  [address] [phone] Mon-Fri 8:00-5:00 Babies seen by providers at [facility] Does NOT accept Medicaid Only accepting babies of parents who  are patients Please call early in hospitalization for appointment (limited availability)  Pediatricians Clark, MD; Frye, MD; Kelleher, MD; Mack, NP; Miller, MD; O'Keller, MD; Patterson, NP; Pudlo, MD; Puzio, MD; Thomas, MD; Tucker, MD; Twiselton, MD [address], [place], [place] [address] [phone] Mon-Fri 8:00-5:00, Sat 9:00-12:00 Providers come to see babies at [facility] Does NOT accept Medicaid  [place] ([address]) [facility] providers accepting new patients: Brake, NP; Wharton, PA [address], [place], [place] [address] [phone] Mon-Fri 8:00-5:00 Babies seen by providers at [facility] Does NOT accept Medicaid Only accepting babies of parents who are patients Please call early in hospitalization for appointment (limited availability) [facility] Gay, MD; Quinlan, MD [address] [place], [place] [address] [phone] (press [phone] to schedule appointment) Mon-Fri 8:00-5:00 Providers come to see babies at [facility] Does NOT accept Medicaid [facility] Mazer, MD [address] [place], [place] [address] [phone] Mon-Fri 8:30-5:00 (lunch 12:30-1:00), extended hours by appointment only Wed 5:00-6:30 Babies seen by [facility] providers Accepting Medicaid [facility] Banks, MD; Jordan, MD; Koberlein, MD [address], [place], [place] [address] [phone] Mon-Fri 8:00-5:00 Babies seen by [facility] providers Does NOT accept Medicaid [facility] Parker, MD; Hunter, MD; Wallace, DO [address] [place], [place] [address] [phone] Mon-Fri 8:00-5:00 Babies seen by [facility] providers Does NOT accept Medicaid [facility] Brandon, PA; Brecken, PA; Christy, NP; Dees, MD; DeClaire, MD; DeWeese, MD; Hansen, NP; Mills, NP; Parrish, NP; Smoot, NP; Summer, MD; Vapne,  MD [address] [place], [place] [address] [phone] Mon-Fri 8:30-5:00, Sat 10:00-1:00 Providers come to see babies at [facility] Does NOT accept Medicaid Free prenatal information session Tuesdays at 4:45pm [facility] Bouska, MD; Gordon, PA; Jeffery, PA; Weber, PA [address] [place] [address] [phone] Mon-Fri 7:30-5:30 Babies seen by [facility] providers [facility] [address], [place], [place]  [address] [phone]   Fax - [phone]  [place] ([address] & [address]) [facility] [place] [address] [place], [place] [address] [phone] Mon-Thur 8:00-6:00 Providers come to see babies at [facility] Accepting Medicaid [facility] Anderson, NP; Badger, MD; Beal, PA; Spencer, PA [address] [place], [place] [address] [phone] Mon-Thur 7:30-7:30, Fri 7:30-4:30 Babies seen by [facility] providers Accepting Medicaid [facility] Agbuya, MD; Klett, NP; Romgoolam, MD [address], [place], [place] [address] [phone] Mon-Fri 8:30-5:00, Sat 8:30-12:00 Providers come to see babies at [facility] Accepting Medicaid Must have "Meet & Greet" appointment at office prior to delivery [facility] ([facility]) McCord, MD; Wallace, MD; Wood, MD [address], [place], [place] [address] [phone] Mon-Wed 8:00-6:00, Thur-Fri 8:00-5:00, Sat 9:00-12:00 Providers come to see babies at [facility] Does NOT accept Medicaid Only accepting siblings of current patients [facility]  [address], [place], [place]  [address] [phone]   Fax - [phone] [facility] [address], [place], [place]  [address] [phone]   Fax -  [phone]  [place] ([address] & [address])  [facility] Cirigliano, DO; Matthews, DO [address] [place], [place] [address] [phone] Mon-Fri 7:00-5:00 Babies seen by [facility] providers Does NOT accept Medicaid [facility] Briscoe, MD; Howley, PA; Moreira, PA [address], [place], [place] [address] [phone] Mon-Fri 8:00-5:00 Babies seen by [facility] providers Accepting Medicaid [facility] Boyd, MD; Church, PA; Jones, NP; Osborn, PA [address], [place], [place] [address] [phone] Mon-Fri 8:00-5:00 Babies seen by providers at [facility] Accepting Medicaid  [place] ([address]) [facility] [place] Wendling, DO [address] [place], [place] [address] [phone] Mon-Fri 8:00-5:00 Babies seen by [facility] providers Does NOT accept Medicaid Limited availability, please call early in hospitalization to schedule follow-up [facility] Calderon, PA; Cummings, MD; Dillard, MD; Martin, PA; Olson, MD; VanDeven, PA [address], [place], [place] [address] [phone] Mon-Fri 8:30-5:00, Sat 9:00-12:00 Babies seen by providers at [facility] Accepting Medicaid Please register online then schedule online or call office [email or web address] Hunter, NP; Kumar, MD; Martin Rogers, PA [address], [place], [place] [address] [phone] Mon-Fri 8:00-5:00 Babies seen by providers at [facility] Accepting Medicaid [facility] ([facility]) [place], MD; Kristi Fleenor, NP; West, MD [address], [place], [place] [address] [phone] Mon-Fri 8:00-5:30, Sat&Sun by appointment (phones open at  8:30) Babies seen by [facility] providers Accepting Medicaid Must be a first-time baby or sibling of current patient [facility] [place]  [address], [place], [place]  [address] [phone]   Fax - [phone]  [place] ([address]) [facility] Brown, PA; [place], PA; Rice, MD; Helton, PA; Spry, MD [address] [place], [place] [address] [phone] Mon-Thur 8:00-7:00, Fri 8:00-5:00, Sat 8:00-12:00, Sun 9:00-12:00 Babies seen by [facility] providers Accepting Medicaid [facility] Coe-Goins, MD; Marshall, MD; Pierre-Louis, MD [address], [place], [place] [address] [phone] Mon-Thur 8:00-5:00 Babies seen by providers at [facility] Accepting Medicaid [facility] Bratton, MD; Coe-Goins, MD; Hayes, MD; Lewis, MD; List, MD; Lott, MD; Marshall, MD; Moran, MD; O'Neal, MD; Pierre-Louis, MD; Pitonzo, MD; Scholer, MD; Spangle, MD [address] [place], [place] [address] [phone] Mon-Fri 8:00-5:30, Sat (Oct.-Mar.) 9:00-1:00 Babies seen by providers at [facility] Accepting Medicaid Must fill out new patient packet, available online at [email or web address] [facility] ([facility]) Friddle, NP; Harris, NP; Kelly, NP; Logan, MD; Melvin, PA; Poth, MD; Ramadoss, MD; Stanton, NP [address], [place], [place] [address] [phone] Mon-Thur 8:00-5:30, Fri 8:00-5:00 Babies seen by providers at [facility] Accepting Medicaid  [place] ([address]) [facility] Dixon, PA; [place], MD; Pickard, MD; Tapia, PA [address], [place], [place] [address] [phone] Mon-Fri 8:00-5:00 Babies seen by providers at [facility] Accepting Medicaid   [place] ([address]) [facility] [place] Masneri, DO; Meyers, MD; Nelson, PA [address], [place], [place] [address] [phone] Mon-Fri 8:00-5:00 Babies seen by providers at [facility] Does NOT accept Medicaid Limited appointment availability, please call early in hospitalization  [facility] [place] Kunedd, DO; McGowen, MD [address], [place], [place] [address] [phone] Mon-Fri 8:00-5:00 Babies seen by [facility] providers Does NOT accept Medicaid [facility] [place] Cameron, MD; MacDonald, MD; Michaels, PA; Nayak, MD [address] [place] [address], [place], [place] [address] [phone] Mon-Fri 8:00-5:00 After hours clinic ([address] [place],  [address]) [phone] Mon-Fri 5:00-8:00, Sat 12:00-6:00, Sun 10:00-4:00 Babies seen by [facility] providers Accepting Medicaid [facility] [place] [address], [place], [place]  [address] [phone]   Fax - [phone]  [place] ([address]) [facility] [place] [facility] Andy, MD [address], [place], [place] [address] [phone] Mon-Fri 8:00-5:00 Babies seen by [facility] providers Does NOT accept Medicaid [facility] [place] ([facility] [place]) Eksir, MD [address], [place], [place] [address] [phone] Mon-Thur 8:00-7:00, Fri 8:00-5:00, Sat 8:00-12:00 Babies seen by providers at [facility] Accepting Medicaid - but does not have vaccinations in office (must be received elsewhere) Limited availability, please call early in hospitalization  [place] ([address]) [facility]  Charlene Flemming, MD [address], [place] [address] [phone]  Fax [phone]  [facility]  Kimberly Newton, MD, Annamarie Streilein, PA, Carla Hampton, PA [address] [place], [place]  [address] [phone] [facility]  [address], [place], [place] [address] [phone] [address], [place], [place] [address] [phone] (West Office)  [facility] [address], [place], [place] [address] [phone] [facility] [address] [place], [place], [place] [address] [phone] [facility] [address], [place], [place] [address] [phone] [facility] [address], [place], [place] [address] [phone] [facility] [address], [place], [place] [address] [phone] [facility] [address], [place], [place] [address] [phone] [facility]  [address], [place], [place] [address] [phone] Dr[facility] [address], [place], [place] [address] [phone] [facility] [address], [place], [place] [address] [phone] [facility] [address], [place], [place] [address] [phone]  

## 2022-02-20 NOTE — Progress Notes (Signed)
Pt presents for ROB without complaints per pt.  She has GDM supplies but has not started checking CBG yet - N&D appt today. Tdap given LD without complaints today.

## 2022-02-20 NOTE — Progress Notes (Signed)
° °  PRENATAL VISIT NOTE  Subjective:  Janice Kemp is a 21 y.o. G1P0 at [redacted]w[redacted]d being seen today for ongoing prenatal care.  She is currently monitored for the following issues for this high-risk pregnancy and has Supervision of other normal pregnancy, antepartum; Obesity in pregnancy; Echogenic intracardiac focus of fetus on prenatal ultrasound; and Gestational diabetes mellitus (GDM) affecting pregnancy, antepartum on their problem list.  Patient reports occasional contractions.  Contractions: Irritability. Vag. Bleeding: None.  Movement: Present. Denies leaking of fluid.   The following portions of the patient's history were reviewed and updated as appropriate: allergies, current medications, past family history, past medical history, past social history, past surgical history and problem list.   Objective:   Vitals:   02/20/22 0855  BP: 122/75  Pulse: (!) 107  Weight: 249 lb (112.9 kg)    Fetal Status: Fetal Heart Rate (bpm): 156    Movement: Present     General:  Alert, oriented and cooperative. Patient is in no acute distress.  Skin: Skin is warm and dry. No rash noted.   Cardiovascular: Normal heart rate noted  Respiratory: Normal respiratory effort, no problems with respiration noted  Abdomen: Soft, gravid, appropriate for gestational age.  Pain/Pressure: Absent     Pelvic: Cervical exam deferred        Extremities: Normal range of motion.  Edema: None  Mental Status: Normal mood and affect. Normal behavior. Normal judgment and thought content.   Assessment and Plan:  Pregnancy: G1P0 at [redacted]w[redacted]d 1. Supervision of other normal pregnancy, antepartum - Tdap vaccine greater than or equal to 7yo IM - Few BH contractions, discussed S/S of PTL vs braxton hicks   2. Gestational diabetes mellitus (GDM) affecting pregnancy, antepartum - Diabetes education scheduled for today  - [place] with MFM 3/2 for growth already scheduled  - Not started on ASA prior to 20 weeks   3. Echogenic  intracardiac focus of fetus on prenatal ultrasound - Follow-up MFM [place] scheduled 3/2  4. Obesity in pregnancy - Weight gain reviewed - Patient has started to make dietary changes since GDM diagnosis   5. [redacted] weeks gestation of pregnancy  Preterm labor symptoms and general obstetric precautions including but not limited to vaginal bleeding, contractions, leaking of fluid and fetal movement were reviewed in detail with the patient. Please refer to After Visit Summary for other counseling recommendations.   Return in about 2 weeks (around 03/06/2022) for [facility] APP, In-Person.  Future Appointments  Date Time Provider [place]  02/20/2022 10:15 AM [facility]  02/27/2022 10:45 AM [facility] NURSE [facility]  02/27/2022 11:00 AM [facility] US1 [facility]    Kerry Hough, PA-C

## 2022-02-20 NOTE — Progress Notes (Signed)
Patient was seen for Gestational Diabetes self-management on 02/20/22  Start time 10:15 and End time 11:13   Estimated due date: 04/23/22; [redacted]w[redacted]d  Clinical: Medications: reviewed Medical History: reviewed Labs: OGTT 1 hr 180 mg/dL, L8G 5.3%   Dietary and Lifestyle History: Pt states she did not have any questions to be covered in appointment and has knowledge regarding diabetes, her father has T2D.  Patient states she enjoys carbs such as pasta, rice, potatoes and knows to eat in moderation. Pt states she eats vegetables daily including leafy greens. Pt reports she does not eat beans. Pt states she has not tried zucchini.   Physical Activity: not assessed Stress: not assessed Sleep: not assessed  24 hr Recall: not assessed First Meal: Snack: Second meal: Snack: Third meal: Snack: Beverages:  NUTRITION INTERVENTION  Nutrition education (E-1) on the following topics:   Initial Follow-up  [x]  []  Definition of Gestational Diabetes [x]  []  Why dietary management is important in controlling blood glucose [x]  []  Effects each nutrient has on blood glucose levels [x]  []  Simple carbohydrates vs complex carbohydrates [x]  []  Fluid intake [x]  []  Creating a balanced meal plan [x]  []  Carbohydrate counting  [x]  []  When to check blood glucose levels [x]  []  Proper blood glucose monitoring techniques [x]  []  Effect of stress and stress reduction techniques  [x]  []  Exercise effect on blood glucose levels, appropriate exercise during pregnancy [x]  []  Importance of limiting caffeine and abstaining from alcohol and smoking [x]  []  Medications used for blood sugar control during pregnancy [x]  []  Hypoglycemia and rule of 15 [x]  []  Postpartum self care  Patient already has a meter, is testing pre breakfast and 2 hours after each meal. Pt states her blood sugar if fine  Patient instructed to monitor glucose levels: FBS: 60 - ? 95 mg/dL (some clinics use 90 for cutoff) 1 hour: ? 140 mg/dL 2  hour: ? mg/dL  Patient received handouts: Nutrition Diabetes and Pregnancy Carbohydrate Counting List  Patient will be seen for follow-up as needed.

## 2022-02-22 ENCOUNTER — Other Ambulatory Visit: Payer: Self-pay

## 2022-02-22 ENCOUNTER — Encounter (HOSPITAL_COMMUNITY): Payer: Self-pay | Admitting: Family Medicine

## 2022-02-22 ENCOUNTER — Inpatient Hospital Stay (HOSPITAL_COMMUNITY)
Admission: AD | Admit: 2022-02-22 | Discharge: 2022-02-22 | Disposition: A | Discharge status: Home / Self Care | Payer: 59 | Attending: Family Medicine | Admitting: Family Medicine

## 2022-02-22 DIAGNOSIS — Z3689 Encounter for other specified antenatal screening: Secondary | ICD-10-CM

## 2022-02-22 DIAGNOSIS — Z3493 Encounter for supervision of normal pregnancy, unspecified, third trimester: Secondary | ICD-10-CM

## 2022-02-22 DIAGNOSIS — N939 Abnormal uterine and vaginal bleeding, unspecified: Secondary | ICD-10-CM

## 2022-02-22 DIAGNOSIS — O98813 Other maternal infectious and parasitic diseases complicating pregnancy, third trimester: Secondary | ICD-10-CM | POA: Insufficient documentation

## 2022-02-22 DIAGNOSIS — O4693 Antepartum hemorrhage, unspecified, third trimester: Secondary | ICD-10-CM | POA: Insufficient documentation

## 2022-02-22 DIAGNOSIS — Z3A31 31 weeks gestation of pregnancy: Secondary | ICD-10-CM | POA: Diagnosis not present

## 2022-02-22 DIAGNOSIS — B3731 Acute candidiasis of vulva and vagina: Secondary | ICD-10-CM | POA: Diagnosis not present

## 2022-02-22 DIAGNOSIS — N898 Other specified noninflammatory disorders of vagina: Secondary | ICD-10-CM

## 2022-02-22 DIAGNOSIS — O283 Abnormal ultrasonic finding on antenatal screening of mother: Secondary | ICD-10-CM

## 2022-02-22 DIAGNOSIS — O24419 Gestational diabetes mellitus in pregnancy, unspecified control: Secondary | ICD-10-CM

## 2022-02-22 DIAGNOSIS — O368130 Decreased fetal movements, third trimester, not applicable or unspecified: Secondary | ICD-10-CM | POA: Diagnosis present

## 2022-02-22 DIAGNOSIS — Z3480 Encounter for supervision of other normal pregnancy, unspecified trimester: Secondary | ICD-10-CM

## 2022-02-22 LAB — WET PREP, GENITAL
Clue Cells Wet Prep HPF POC: NONE SEEN
Sperm: NONE SEEN
Trich, Wet Prep: NONE SEEN
WBC, Wet Prep HPF POC: <10 (ref <10)
Yeast Wet Prep HPF POC: NONE SEEN

## 2022-02-22 MED ORDER — FLUCONAZOLE 150 MG PO TABS: 150.0000 mg | ORAL_TABLET | Freq: Every day | ORAL | 0 refills | Status: DC

## 2022-02-22 NOTE — MAU Provider Note (Signed)
Event Date/Time   First Provider Initiated Contact with Patient 02/22/22 1651     S Ms. Janice Kemp is a 21 y.o. G1P0 pregnant female who presents to MAU today with complaint of decreased fetal movement and "pink streaks when I wipe." Also reports increased vaginal discharge which she's noticed since her last MAU visit for round ligament pain. Round ligament pains significantly improved with regular use of belly band. No other physical complaints, specifically denies any cramping or contractions.   Pertinent items noted in HPI and remainder of comprehensive ROS otherwise negative.   Receives care at [facility], prenatal records reviewed.  O BP 112/65 (BP Location: Right Arm)    Pulse 86    Temp 98.2 F (36.8 C) (Oral)    Resp 20    Ht 5\' 6"  (1.676 m)    Wt 253 lb 14.4 oz (115.2 kg)    LMP 06/30/2021    SpO2 100%    BMI 40.98 kg/m  Physical Exam Vitals and nursing note reviewed.  Constitutional:      Appearance: Normal appearance.  HENT:     Head: Normocephalic and atraumatic.  Eyes:     Pupils: Pupils are equal, round, and reactive to light.  Cardiovascular:     Rate and Rhythm: Normal rate and regular rhythm.  Pulmonary:     Effort: Pulmonary effort is normal.  Abdominal:     Palpations: Abdomen is soft.  Musculoskeletal:        General: Normal range of motion.  Skin:    General: Skin is warm and dry.     Capillary Refill: Capillary refill takes less than 2 seconds.  Neurological:     Mental Status: She is alert and oriented to person, place, and time.  Psychiatric:        Mood and Affect: Mood normal.        Behavior: Behavior normal.        Thought Content: Thought content normal.        Judgment: Judgment normal.   Fetal Tracing: reactive Baseline: 145 Variability: moderate Accelerations: 15x15 Decelerations: none Toco: relaxed with occasional UI due to maternal movement  MAU Course/MDM: Pt verbalized displeasure at the length of MAU stay (2hrs from time of  registration) just prior to my entering room. Apologized profusely for delay due to MAU acuity. Pt visibly calmed and appropriately engaged in remainder of exam/visit.  Denies contractions or cramping, but is having increased discharge with pink streaks today. Declined cervical exam but accepted offer to retest for yeast/BV as they can be irritating to the surrounding tissues enough to cause pink spotting. Pt amenable. Thick, white, chunky discharge noted on exam, but no blood. Wet prep collected but will treat empirically for yeast. Reviewed treatment options with pt, she prefers one time dose of diflucan. Will send prescription to pharmacy.  Decreased fetal movement resolved as soon as monitoring bands applied. Praised pt for coming in despite wait and encouraged her to always do so considering her GDM and 3rd trimester. Discussed ways to stimulate movement and kick counts.  A Decreased fetal movement, third trimester Vaginal spotting in pregnancy NST reactive Fetal movement present, third trimester Yeast vaginitis [redacted] weeks gestation of pregnancy  P Discharge from MAU in stable condition with return precautions Follow up at [facility] as scheduled for ongoing prenatal care Diflucan sent to pharmacy.  08/31/2021, Bernerd Limbo 02/22/2022 5:08 PM

## 2022-02-22 NOTE — MAU Note (Signed)
Presents with c/o decreased FM, reports feels movement but less than usual.  Also states having spotting that began yesterday.  States last intercourse 4 days ago.  Denies LOF.

## 2022-02-25 ENCOUNTER — Other Ambulatory Visit: Payer: Self-pay

## 2022-02-27 ENCOUNTER — Ambulatory Visit (HOSPITAL_BASED_OUTPATIENT_CLINIC_OR_DEPARTMENT_OTHER): Payer: 59 | Admitting: Maternal & Fetal Medicine

## 2022-02-27 ENCOUNTER — Other Ambulatory Visit: Payer: Self-pay | Admitting: *Deleted

## 2022-02-27 ENCOUNTER — Encounter: Payer: Self-pay | Admitting: *Deleted

## 2022-02-27 ENCOUNTER — Other Ambulatory Visit: Payer: Self-pay

## 2022-02-27 ENCOUNTER — Ambulatory Visit: Payer: 59 | Admitting: *Deleted

## 2022-02-27 ENCOUNTER — Ambulatory Visit: Payer: 59 | Attending: Obstetrics and Gynecology

## 2022-02-27 VITALS — BP 144/80 | HR 118

## 2022-02-27 VITALS — BP 116/67 | HR 87

## 2022-02-27 DIAGNOSIS — O99213 Obesity complicating pregnancy, third trimester: Secondary | ICD-10-CM | POA: Insufficient documentation

## 2022-02-27 DIAGNOSIS — O24419 Gestational diabetes mellitus in pregnancy, unspecified control: Secondary | ICD-10-CM

## 2022-02-27 DIAGNOSIS — Z3480 Encounter for supervision of other normal pregnancy, unspecified trimester: Secondary | ICD-10-CM

## 2022-02-27 DIAGNOSIS — Z3689 Encounter for other specified antenatal screening: Secondary | ICD-10-CM

## 2022-02-27 DIAGNOSIS — O283 Abnormal ultrasonic finding on antenatal screening of mother: Secondary | ICD-10-CM

## 2022-02-27 DIAGNOSIS — O99212 Obesity complicating pregnancy, second trimester: Secondary | ICD-10-CM | POA: Diagnosis not present

## 2022-02-27 DIAGNOSIS — O358XX0 Maternal care for other (suspected) fetal abnormality and damage, not applicable or unspecified: Secondary | ICD-10-CM | POA: Insufficient documentation

## 2022-02-27 DIAGNOSIS — Z3A32 32 weeks gestation of pregnancy: Secondary | ICD-10-CM | POA: Diagnosis not present

## 2022-02-27 NOTE — Progress Notes (Signed)
MFM Brief Note ? ?Ms. Kruczek is a 8 w 1 d who is here at the request of Arbie Cookey CNM. ? ?Follow up growth due to elevated BMI with newly diagnosis of A1GDM ?Normal interval growth with measurements consistent with dates ?Good fetal movement and amniotic fluid volume  ? ?I reviewed today's study and recommend serial growth and initiation of weekly testing if Ms. Kleckley requires medical therapy. ? ?We discussed the increased risk for fetal macrosomia, maternal and fetal birth trauma with temporary and/or permenant damage, stillbirth and neonatal ICU admission in pregnancies with uncontrolled blood sugar. ? ?In addition, we reviewed her goals of FBS 60-90 and 2hr pp <120 mg/dL.  She reports that her bloood sugars are within range. ? ?She had a GDM education appt on 02/25 ? ?She was scheduled for follow up growth in 4 weeks. ? ?I spent 20 minutes with > 50% in face to face consultation. ? ?All questions answered ? ?Novella Olive, MD ?

## 2022-03-06 ENCOUNTER — Ambulatory Visit (INDEPENDENT_AMBULATORY_CARE_PROVIDER_SITE_OTHER): Payer: 59 | Admitting: Family Medicine

## 2022-03-06 ENCOUNTER — Encounter: Payer: Self-pay | Admitting: Family Medicine

## 2022-03-06 ENCOUNTER — Other Ambulatory Visit: Payer: Self-pay

## 2022-03-06 VITALS — BP 120/79 | HR 99

## 2022-03-06 DIAGNOSIS — Z3480 Encounter for supervision of other normal pregnancy, unspecified trimester: Secondary | ICD-10-CM

## 2022-03-06 DIAGNOSIS — O24419 Gestational diabetes mellitus in pregnancy, unspecified control: Secondary | ICD-10-CM

## 2022-03-06 DIAGNOSIS — Z3A33 33 weeks gestation of pregnancy: Secondary | ICD-10-CM

## 2022-03-06 DIAGNOSIS — O283 Abnormal ultrasonic finding on antenatal screening of mother: Secondary | ICD-10-CM

## 2022-03-06 NOTE — Progress Notes (Signed)
? ? ?  Subjective:  ?Janice Kemp is a 21 y.o. G1P0 at [redacted]w[redacted]d being seen today for ongoing prenatal care.  She is currently monitored for the following issues for this high-risk pregnancy and has Supervision of other normal pregnancy, antepartum; Obesity in pregnancy; Echogenic intracardiac focus of fetus on prenatal ultrasound; and Gestational diabetes mellitus (GDM) affecting pregnancy, antepartum on their problem list. ? ?Patient reports no complaints. Wants to discuss labor and circumcisions. Fasting CBG has been 80-90 and 2 hr pp has been 80-107.  Contractions: Not present. Vag. Bleeding: None.  Movement: Present. Denies leaking of fluid.  ? ?The following portions of the patient's history were reviewed and updated as appropriate: allergies, current medications, past family history, past medical history, past social history, past surgical history and problem list.  ? ?Objective:  ? ?Vitals:  ? 03/06/22 1002  ?BP: 120/79  ?Pulse: 99  ? ? ?Fetal Status: Fetal Heart Rate (bpm): 146 Fundal Height: 33 cm Movement: Present    ? ?General:  Alert, oriented and cooperative. Patient is in no acute distress.  ?Skin: Skin is warm and dry. No rash noted.   ?Cardiovascular: Normal heart rate noted  ?Respiratory: Normal respiratory effort, no problems with respiration noted  ?Abdomen: Soft, gravid, appropriate for gestational age. Pain/Pressure: Absent     ?Pelvic:  Cervical exam deferred        ?Extremities: Normal range of motion.  Edema: None  ?Mental Status: Normal mood and affect. Normal behavior. Normal judgment and thought content.  ? ? ?Assessment and Plan:  ?Pregnancy: G1P0 at [redacted]w[redacted]d ? ?1. Supervision of other normal pregnancy, antepartum ?Doing well with normal fetal movement. Discussed labor expectations and baby friendly protocol in hospital.  ? ?2. [redacted] weeks gestation of pregnancy ? ?3. Gestational diabetes mellitus (GDM) affecting pregnancy, antepartum ?CBGs appropriate at home. No meds. EFW 87% during 3/2 [place],  follow up growth on 3/30.   ? ?4. Echogenic intracardiac focus of fetus on prenatal ultrasound ?LR NIPS ? ? ?Preterm labor symptoms and general obstetric precautions including but not limited to vaginal bleeding, contractions, leaking of fluid and fetal movement were reviewed in detail with the patient. ?Please refer to After Visit Summary for other counseling recommendations.  ? ?Return for 2-3 weeks for HROB . ? ? ?Patriciaann Clan, DO ?

## 2022-03-06 NOTE — Progress Notes (Signed)
Patient presents for ROB.  ?Has BS log to show provider. ?

## 2022-03-15 ENCOUNTER — Inpatient Hospital Stay (HOSPITAL_COMMUNITY)
Admission: AD | Admit: 2022-03-15 | Discharge: 2022-03-15 | Disposition: A | Discharge status: Home / Self Care | Payer: 59 | Attending: Family Medicine | Admitting: Family Medicine

## 2022-03-15 ENCOUNTER — Other Ambulatory Visit: Payer: Self-pay

## 2022-03-15 ENCOUNTER — Encounter (HOSPITAL_COMMUNITY): Payer: Self-pay | Admitting: Family Medicine

## 2022-03-15 DIAGNOSIS — O26899 Other specified pregnancy related conditions, unspecified trimester: Secondary | ICD-10-CM

## 2022-03-15 DIAGNOSIS — Z3A34 34 weeks gestation of pregnancy: Secondary | ICD-10-CM | POA: Insufficient documentation

## 2022-03-15 DIAGNOSIS — O26893 Other specified pregnancy related conditions, third trimester: Secondary | ICD-10-CM | POA: Diagnosis present

## 2022-03-15 DIAGNOSIS — R102 Pelvic and perineal pain: Secondary | ICD-10-CM | POA: Diagnosis not present

## 2022-03-15 LAB — URINALYSIS, ROUTINE W REFLEX MICROSCOPIC
Bilirubin Urine: NEGATIVE
Glucose, UA: NEGATIVE mg/dL
Hgb urine dipstick: NEGATIVE
Ketones, ur: NEGATIVE mg/dL
Leukocytes,Ua: NEGATIVE
Nitrite: NEGATIVE
Protein, ur: NEGATIVE mg/dL
Specific Gravity, Urine: 1.017 (ref 1.005–1.030)
pH: 7 (ref 5.0–8.0)

## 2022-03-15 NOTE — MAU Provider Note (Signed)
?History  ?  ? ?CSN: [id number] ? ?Arrival date and time: 03/15/22 1638 ? ? None  ?  ? ?Chief Complaint  ?Patient presents with  ? Pelvic pressure  ? ?HPI ? ?Janice Kemp is a 21 y.o. G1P0 at [redacted]w[redacted]d by 5 week ultrasound who presents to MAU for pelvic pressure. Patient reports pressure started last night, but worsened today. Pressure is worse with walking. She denies contractions, vaginal bleeding, or leaking fluid. Endorses active fetal movement.  ? ?Patient receives prenatal care at [facility]. Pregnancy has been complicated by A1GDM.  ? ?OB History   ? ? Gravida  ?1  ? Para  ?   ? Term  ?   ? Preterm  ?   ? AB  ?   ? Living  ?   ?  ? ? SAB  ?   ? IAB  ?   ? Ectopic  ?   ? Multiple  ?   ? Live Births  ?   ?   ?  ?  ? ? ?Past Medical History:  ?Diagnosis Date  ? Gestational diabetes   ? ? ?Past Surgical History:  ?Procedure Laterality Date  ? TONSILECTOMY/ADENOIDECTOMY WITH MYRINGOTOMY  2005  ? ? ?Family History  ?Problem Relation Age of Onset  ? Hypertension Mother   ? Hypertension Maternal Grandmother   ? ? ?Social History  ? ?Tobacco Use  ? Smoking status: Never  ? Smokeless tobacco: Never  ?Vaping Use  ? Vaping Use: Never used  ?Substance Use Topics  ? Alcohol use: Not Currently  ?  Comment: not since confirmed pregnancy  ? Drug use: Not Currently  ?  Types: Marijuana  ?  Comment: not since confirmed pregnancy  ? ? ?Allergies: No Known Allergies ? ?No medications prior to admission.  ? ? ?Review of Systems  ?Constitutional: Negative.   ?Respiratory: Negative.    ?Cardiovascular: Negative.   ?Gastrointestinal: Negative.   ?Genitourinary:  Positive for pelvic pain. Negative for dysuria, vaginal bleeding and vaginal discharge.  ?Musculoskeletal: Negative.   ?Neurological: Negative.   ?Physical Exam  ? ?Patient Vitals for the past 24 hrs: ? BP Temp Temp src Pulse Resp SpO2 Height Weight  ?03/15/22 1747 120/75 -- -- (!) 101 -- 100 % -- --  ?03/15/22 1743 -- -- -- 92 -- -- -- --  ?03/15/22 1700 130/74 99.1 ?F (37.3 ?C)  Oral (!) 125 18 98 % -- --  ?03/15/22 1657 -- -- -- -- -- -- 5\' 6"  (1.676 m) 114.4 kg  ? ?Physical Exam ?Vitals and nursing note reviewed. Exam conducted with a chaperone present.  ?Constitutional:   ?   General: She is not in acute distress. ?Eyes:  ?   Extraocular Movements: Extraocular movements intact.  ?   Pupils: Pupils are equal, round, and reactive to light.  ?Cardiovascular:  ?   Rate and Rhythm: Tachycardia present.  ?Pulmonary:  ?   Effort: Pulmonary effort is normal.  ?Abdominal:  ?   Palpations: Abdomen is soft.  ?   Tenderness: There is no abdominal tenderness.  ?   Comments: Gravid  ?Genitourinary: ?   Comments: VE: closed/thick/posterior ?Musculoskeletal:     ?   General: Normal range of motion.  ?   Cervical back: Normal range of motion.  ?Skin: ?   General: Skin is warm and dry.  ?Neurological:  ?   General: No focal deficit present.  ?   Mental Status: She is alert and oriented to person, place, and  time.  ?Psychiatric:     ?   Mood and Affect: Mood normal.     ?   Behavior: Behavior normal.     ?   Thought Content: Thought content normal.     ?   Judgment: Judgment normal.  ? ?NST ?FHR: 145 bpm, moderate variability, +15x15 accels, no decels ?Toco: ui ? ?MAU Course  ?Procedures ?NST ?UA ? ?MDM ?UA negative. Cervix closed/thick/posterior. NST reactive and reassuring for gestational age. Toco quiet. Patient not in labor. ? ?Assessment and Plan  ?[redacted] weeks gestation of pregnancy ?Pelvic pain affecting pregnancy ? ?- Discharge home in stable condition ?- Strict return precautions reviewed ?- Encouraged adequate hydration, rest periods ?- Return to MAU sooner or as needed for worsening symptoms ?- Keep OB appointment as scheduled on 3/23 ? ? ?Brand Males, CNM ?03/15/2022, 6:22 PM  ?

## 2022-03-15 NOTE — MAU Note (Signed)
Janice Kemp is a 21 y.o. at [redacted]w[redacted]d here in MAU reporting: started having pelvic pressure yesterday and it is worse today. Thinks her mucus plug came out. Denies any pain at this time, no bleeding or LOF. +FM ? ?Onset of complaint: yesterday ? ?Pain score: 0/10 ? ?Vitals:  ? 03/15/22 1700  ?BP: 130/74  ?Pulse: (!) 125  ?Resp: 18  ?Temp: 99.1 ?F (37.3 ?C)  ?SpO2: 98%  ?   ?FHT: EFM applied in room ? ?Lab orders placed from triage: UA ? ?

## 2022-03-20 ENCOUNTER — Other Ambulatory Visit: Payer: Self-pay

## 2022-03-20 ENCOUNTER — Ambulatory Visit (INDEPENDENT_AMBULATORY_CARE_PROVIDER_SITE_OTHER): Payer: 59 | Admitting: Obstetrics and Gynecology

## 2022-03-20 VITALS — BP 131/83 | HR 101 | Wt 251.0 lb

## 2022-03-20 DIAGNOSIS — O24419 Gestational diabetes mellitus in pregnancy, unspecified control: Secondary | ICD-10-CM

## 2022-03-20 DIAGNOSIS — Z3A35 35 weeks gestation of pregnancy: Secondary | ICD-10-CM

## 2022-03-20 DIAGNOSIS — Z6841 Body Mass Index (BMI) 40.0 and over, adult: Secondary | ICD-10-CM | POA: Insufficient documentation

## 2022-03-20 DIAGNOSIS — O99210 Obesity complicating pregnancy, unspecified trimester: Secondary | ICD-10-CM

## 2022-03-20 DIAGNOSIS — O0993 Supervision of high risk pregnancy, unspecified, third trimester: Secondary | ICD-10-CM

## 2022-03-20 NOTE — Progress Notes (Signed)
HROB,  FBS 72-86 and 2 hrs pp 97-110. ?Reports no problems today. ?

## 2022-03-20 NOTE — Progress Notes (Signed)
? ?  PRENATAL VISIT NOTE ? ?Subjective:  ?Janice Kemp is a 21 y.o. G1P0 at [redacted]w[redacted]d being seen today for ongoing prenatal care.  She is currently monitored for the following issues for this high-risk pregnancy and has Supervision of other normal pregnancy, antepartum; Obesity in pregnancy; Echogenic intracardiac focus of fetus on prenatal ultrasound; Gestational diabetes mellitus (GDM) affecting pregnancy, antepartum; and BMI 40.0-44.9, adult ([place]) on their problem list. ? ?Patient reports no complaints.  Contractions: Not present. Vag. Bleeding: None.  Movement: Present. Denies leaking of fluid.  ? ?The following portions of the patient's history were reviewed and updated as appropriate: allergies, current medications, past family history, past medical history, past social history, past surgical history and problem list.  ? ?Objective:  ? ?Vitals:  ? 03/20/22 0840  ?BP: 131/83  ?Pulse: (!) 101  ?Weight: 251 lb (113.9 kg)  ? ? ?Fetal Status: Fetal Heart Rate (bpm): 148 Fundal Height: 36 cm Movement: Present  Presentation: Vertex ? ?General:  Alert, oriented and cooperative. Patient is in no acute distress.  ?Skin: Skin is warm and dry. No rash noted.   ?Cardiovascular: Normal heart rate noted  ?Respiratory: Normal respiratory effort, no problems with respiration noted  ?Abdomen: Soft, gravid, appropriate for gestational age.  Pain/Pressure: Present     ?Pelvic: Cervical exam deferred        ?Extremities: Normal range of motion.  Edema: None  ?Mental Status: Normal mood and affect. Normal behavior. Normal judgment and thought content.  ? ?Assessment and Plan:  ?Pregnancy: G1P0 at [redacted]w[redacted]d ?1. BMI 40.0-44.9, adult ([place]) ? ?2. Obesity in pregnancy ? ?3. Supervision of high risk pregnancy in third trimester ?GBS next visit ? ?4. Gestational diabetes mellitus (GDM) affecting pregnancy, antepartum ?Normal CBGs with diet control. Borderline LGA and with large AC at 32wks. F/u surveillance growth u/s next week. I d/w her re:  39wk IOL ? ?Preterm labor symptoms and general obstetric precautions including but not limited to vaginal bleeding, contractions, leaking of fluid and fetal movement were reviewed in detail with the patient. ?Please refer to After Visit Summary for other counseling recommendations.  ? ?Return in about 1 week (around 03/27/2022) for low risk ob, in person, md or app. ? ?Future Appointments  ?Date Time Provider [place]  ?03/27/2022 12:30 PM [facility] NURSE [facility]  ?03/27/2022 12:45 PM [facility] US4 [facility]  ? ? ?Aletha Halim, MD ? ?

## 2022-03-27 ENCOUNTER — Ambulatory Visit: Payer: 59 | Admitting: *Deleted

## 2022-03-27 ENCOUNTER — Ambulatory Visit: Payer: 59 | Attending: Maternal & Fetal Medicine

## 2022-03-27 VITALS — BP 122/69 | HR 102

## 2022-03-27 DIAGNOSIS — O283 Abnormal ultrasonic finding on antenatal screening of mother: Secondary | ICD-10-CM

## 2022-03-27 DIAGNOSIS — Z3A35 35 weeks gestation of pregnancy: Secondary | ICD-10-CM

## 2022-03-27 DIAGNOSIS — Z3480 Encounter for supervision of other normal pregnancy, unspecified trimester: Secondary | ICD-10-CM

## 2022-03-27 DIAGNOSIS — O24419 Gestational diabetes mellitus in pregnancy, unspecified control: Secondary | ICD-10-CM

## 2022-03-27 DIAGNOSIS — O24410 Gestational diabetes mellitus in pregnancy, diet controlled: Secondary | ICD-10-CM | POA: Insufficient documentation

## 2022-03-27 DIAGNOSIS — E669 Obesity, unspecified: Secondary | ICD-10-CM

## 2022-03-27 DIAGNOSIS — Z3A36 36 weeks gestation of pregnancy: Secondary | ICD-10-CM | POA: Insufficient documentation

## 2022-03-27 DIAGNOSIS — O99213 Obesity complicating pregnancy, third trimester: Secondary | ICD-10-CM | POA: Diagnosis not present

## 2022-03-27 DIAGNOSIS — O358XX0 Maternal care for other (suspected) fetal abnormality and damage, not applicable or unspecified: Secondary | ICD-10-CM | POA: Diagnosis present

## 2022-03-28 ENCOUNTER — Ambulatory Visit (INDEPENDENT_AMBULATORY_CARE_PROVIDER_SITE_OTHER): Payer: 59 | Admitting: Obstetrics

## 2022-03-28 ENCOUNTER — Other Ambulatory Visit (HOSPITAL_COMMUNITY)
Admission: RE | Admit: 2022-03-28 | Discharge: 2022-03-28 | Disposition: A | Discharge status: Home / Self Care | Payer: 59 | Source: Ambulatory Visit | Attending: Obstetrics | Admitting: Obstetrics

## 2022-03-28 VITALS — BP 120/79 | HR 96 | Wt 256.2 lb

## 2022-03-28 DIAGNOSIS — O24419 Gestational diabetes mellitus in pregnancy, unspecified control: Secondary | ICD-10-CM

## 2022-03-28 DIAGNOSIS — O99210 Obesity complicating pregnancy, unspecified trimester: Secondary | ICD-10-CM

## 2022-03-28 DIAGNOSIS — O0993 Supervision of high risk pregnancy, unspecified, third trimester: Secondary | ICD-10-CM | POA: Insufficient documentation

## 2022-03-28 DIAGNOSIS — Z3A00 Weeks of gestation of pregnancy not specified: Secondary | ICD-10-CM | POA: Diagnosis not present

## 2022-03-28 NOTE — Progress Notes (Signed)
Pt presents for ROB. 

## 2022-03-28 NOTE — Progress Notes (Signed)
Subjective:  ?Janice Kemp is a 21 y.o. G1P0 at [redacted]w[redacted]d being seen today for ongoing prenatal care.  She is currently monitored for the following issues for this high-risk pregnancy and has Supervision of other normal pregnancy, antepartum; Obesity in pregnancy; Echogenic intracardiac focus of fetus on prenatal ultrasound; Gestational diabetes mellitus (GDM) affecting pregnancy, antepartum; and BMI 40.0-44.9, adult ([place]) on their problem list. ? ?Patient reports no complaints.  Contractions: Not present. Vag. Bleeding: None.  Movement: Present. Denies leaking of fluid.  ? ?The following portions of the patient's history were reviewed and updated as appropriate: allergies, current medications, past family history, past medical history, past social history, past surgical history and problem list. Problem list updated. ? ?Objective:  ? ?Vitals:  ? 03/28/22 1006  ?BP: 120/79  ?Pulse: 96  ?Weight: 256 lb 3.2 oz (116.2 kg)  ? ? ?Fetal Status: Fetal Heart Rate (bpm): 145   Movement: Present    ? ?General:  Alert, oriented and cooperative. Patient is in no acute distress.  ?Skin: Skin is warm and dry. No rash noted.   ?Cardiovascular: Normal heart rate noted  ?Respiratory: Normal respiratory effort, no problems with respiration noted  ?Abdomen: Soft, gravid, appropriate for gestational age. Pain/Pressure: Absent     ?Pelvic:  Cervical exam deferred        ?Extremities: Normal range of motion.  Edema: None  ?Mental Status: Normal mood and affect. Normal behavior. Normal judgment and thought content.  ? ?Urinalysis:     ?  ? ?[place] MFM OB FOLLOW UP (Accession [id number]) (Order [id number]) ?Imaging ?Date: 03/27/2022 Department: [facility] Released By: Claudius Sis Authorizing: Jaynie Collins, MD  ? ?Exam Status ? ?Status  ?Final [99]  ? ?PACS Intelerad Image Link ? ? Show images for [place] MFM OB FOLLOW UP ? ?Study Result ? ?Narrative & Impression   ?---------------------------------------------------------------------- ? OBSTETRICS REPORT                       (Signed Final 03/27/2022 01:47 pm) ?---------------------------------------------------------------------- ?Patient Info ? ID #:       [id number]                          D.O.B.:  14-Dec-2001 (20 yrs) ? Name:       Janice Kemp                 Visit Date: 03/27/2022 01:27 pm ?---------------------------------------------------------------------- ?Performed By ? Attending:        Tama High MD        Ref. Address:     [address] ?                                                            [address] ?                                                            [place], ? Performed By:     B[place]          [facility] ? Referred By:      Kathie Dike LEFTWICH- ?                   KIRBY CNM ?---------------------------------------------------------------------- ?Orders ? #  Description                           Code        Ordered By ? 1  [place] MFM OB FOLLOW UP                   [id number]    [place] ?                                                      BOOKER ?---------------------------------------------------------------------- ? #  Order #                     Accession #                Episode # ? 1  [id number]                   [id number]                 [id number] ?---------------------------------------------------------------------- ?Indications ? Gestational diabetes in pregnancy, diet        O24.410 ? controlled ? Obesity complicating pregnancy, third          O99.213 ? trimester (BMI 39) ? Echogenic intracardiac focus of the heart      O35.8XX0 ? (EIF) ? [redacted] weeks gestation of pregnancy                Z3A.36 ? LR NIPS, Neg  [place] ?---------------------------------------------------------------------- ?Fetal Evaluation ? Num Of Fetuses:         1 ? Fetal Heart Rate(bpm):  153 ? Cardiac Activity:       Observed ? Presentation:           Cephalic ? Placenta:               Anterior ? P. Cord Insertion:      Previously Visualized ? Amniotic Fluid ? AFI FV:      Within normal limits ? AFI Sum(cm)     %  Tile       Largest Pocket(cm) ? 10.2            24          4.3 ? RUQ(cm)       RLQ(cm)       LUQ(cm)        LLQ(cm) ? 0             3.1           2.8            4.3 ?---------------------------------------------------------------------- ?Biometry ? BPD:      86.2  mm     G. Age:  34w 5d         21  %    CI:        75.54   %    70 - 86 ?                                                         FL/HC:      21.5   %    20.1 - 22.1 ? HC:      314.5  mm     G. Age:  35w 2d          8  %    HC/AC:      0.95        0.93 - 1.11 ? AC:       331   mm     G. Age:  37w 0d         82  %    FL/BPD:     78.3   %    71 - 87 ? FL:       67.5  mm     G. Age:  34w 5d         13  %    FL/AC:      20.4   %    20 - 24 ? Est. FW:    2821  gm      6 lb 4 oz     47  % ?---------------------------------------------------------------------- ?OB History ? Gravidity:    1 ? Living:       0 ?---------------------------------------------------------------------- ?Gestational Age ? LMP:           38w 4d        Date:  06/30/21                   EDD:   04/06/22 ? U/S Today:     35w 3d                                        EDD:   04/28/22 ? Best:          36w 1d     Det. By:  U/S C R L  (08/26/21)    EDD:   04/23/22 ?---------------------------------------------------------------------- ?Anatomy ? Stomach:               Appears normal, left   Bladder:                Appears normal ?  sided ? Kidneys:               Appear normal ?---------------------------------------------------------------------- ?Cervix Uterus Adnexa ? Adnexa ? No abnormality  visualized. ?---------------------------------------------------------------------- ?Impression ? Fetal growth is appropriate for gestational age .Amniotic fluid ? is normal and good fetal activity is seen. ? Gestational diabetes. Well-controlled on diet . ?---------------------------------------------------------------------- ?Recommendations ? Follow-up scans as clinically indicated. ?---------------------------------------------------------------------- ?                Tama High, MD ?Electronically Signed Final Report   03/27/2022 01:47 pm ?---------------------------------------------------------------------- ?  ? ?I have spent a total of 20 minutes of face-to-face time, excluding clinical staff time, reviewing notes and preparing to see patient, ordering tests and/or medications, and counseling the patient.  ? ? ?Assessment and Plan:  ?Pregnancy: G1P0 at [redacted]w[redacted]d ? ?1. Supervision of high risk pregnancy in third trimester ?Rx: ?- Culture, beta strep (group b only) ?- Cervicovaginal ancillary only( [place]) ? ?2. Gestational diabetes mellitus (GDM) affecting pregnancy, antepartum ?- good glucose control:  FBS's < 95 and 2 hour PP's < 120 ?- ultrasound yesterday shows EFW = 47th percentile, with normal AFI ? ?3. Obesity in pregnancy ? ? ?Preterm labor symptoms and general obstetric precautions including but not limited to vaginal bleeding, contractions, leaking of fluid and fetal movement were reviewed in detail with the patient. ?Please refer to After Visit Summary for other counseling recommendations.  ? ?Return in about 1 week (around 04/04/2022) for [place]. ? ? ?Shelly Bombard, MD  ?03/28/22  ?

## 2022-03-31 LAB — CERVICOVAGINAL ANCILLARY ONLY
Bacterial Vaginitis (gardnerella): NEGATIVE
Candida Glabrata: NEGATIVE
Candida Vaginitis: NEGATIVE
Chlamydia: NEGATIVE
Comment: NEGATIVE
Comment: NEGATIVE
Comment: NEGATIVE
Comment: NEGATIVE
Comment: NEGATIVE
Comment: NORMAL
Neisseria Gonorrhea: NEGATIVE
Trichomonas: NEGATIVE

## 2022-04-01 LAB — CULTURE, BETA STREP (GROUP B ONLY): Strep Gp B Culture: NEGATIVE

## 2022-04-03 ENCOUNTER — Encounter: Payer: Self-pay | Admitting: Obstetrics and Gynecology

## 2022-04-03 ENCOUNTER — Telehealth (HOSPITAL_COMMUNITY): Payer: Self-pay | Admitting: *Deleted

## 2022-04-03 ENCOUNTER — Ambulatory Visit (INDEPENDENT_AMBULATORY_CARE_PROVIDER_SITE_OTHER): Payer: 59 | Admitting: Obstetrics and Gynecology

## 2022-04-03 VITALS — BP 111/76 | HR 95 | Wt 253.0 lb

## 2022-04-03 DIAGNOSIS — Z3480 Encounter for supervision of other normal pregnancy, unspecified trimester: Secondary | ICD-10-CM

## 2022-04-03 DIAGNOSIS — O99210 Obesity complicating pregnancy, unspecified trimester: Secondary | ICD-10-CM

## 2022-04-03 DIAGNOSIS — O24419 Gestational diabetes mellitus in pregnancy, unspecified control: Secondary | ICD-10-CM

## 2022-04-03 NOTE — Progress Notes (Signed)
? ?LOW-RISK PREGNANCY OFFICE VISIT ?Patient name: Janice Kemp MRN [id number]  Date of birth: 12/28/2001 ?Chief Complaint:   ?Routine Prenatal Visit ? ?History of Present Illness:   ?Janice Kemp is a 21 y.o. G1P0 female at [redacted]w[redacted]d with an Estimated Date of Delivery: 04/23/22 being seen today for ongoing management of a low-risk pregnancy.  ?Today she reports no complaints. Contractions: Irritability. Vag. Bleeding: None.  Movement: Present. denies leaking of fluid. ?Review of Systems:   ?Pertinent items are noted in HPI ?Denies abnormal vaginal discharge w/ itching/odor/irritation, headaches, visual changes, shortness of breath, chest pain, abdominal pain, severe nausea/vomiting, or problems with urination or bowel movements unless otherwise stated above. ?Pertinent History Reviewed:  ?Reviewed past medical,surgical, social, obstetrical and family history.  ?Reviewed problem list, medications and allergies. ?Physical Assessment:  ? ?Vitals:  ? 04/03/22 1034  ?BP: 111/76  ?Pulse: 95  ?Weight: 253 lb (114.8 kg)  ?Body mass index is 40.84 kg/m?. ?  ?     Physical Examination:  ? General appearance: Well appearing, and in no distress ? Mental status: Alert, oriented to person, place, and time ? Skin: Warm & dry ? Cardiovascular: Normal heart rate noted ? Respiratory: Normal respiratory effort, no distress ? Abdomen: Soft, gravid, nontender ? Pelvic: Cervical exam deferred        ? Extremities: Edema: None ? ?Fetal Status: Fetal Heart Rate (bpm): 143   Movement: Present   ? ?No results found for this or any previous visit (from the past 24 hour(s)).  ?Assessment & Plan:  ?1) Low-risk pregnancy G1P0 at [redacted]w[redacted]d with an Estimated Date of Delivery: 04/23/22  ? ?2) Supervision of other normal pregnancy, antepartum ?- Advised to take EPO and RRLT daily ?- Information provided on: Alternatives for Labor Prep ? ?Evening Primrose Oil ?It is recommended that you take 1000 -2000 mg; usually divided. Take one capsule by mouth  every morning. Then every night at bedtime, pierce one end with a sterilized pin, insert that end vaginally starting at [redacted] weeks pregnant. ? ?Red Raspberry Leaf Tea ?If you do decide to take raspberry leaf tea, it's recommended that you start when you are about [redacted] weeks pregnant, not before. This will give enough time for it to build up in your body. Begin with one cup a day, gradually increasing to three cups. ? ?The Marathon Oil for Irregular Contractions (to be done at 37 weeks or greater) ?www.themilescircuit.com ?Start practicing the circuit at 37 weeks for 10 minutes per position per day, adding a few minutes each day until you reach the full 90 minutes. You should always have someone help you with each position change. ? ?- IOL scheduled for 39 wks d/t GDMA1 ?- IOL orders placed in Epic ? ?3) Gestational diabetes mellitus (GDM) affecting pregnancy, antepartum ?- Stopped checking BS "since [place] MD told me my baby was growing normally and I eat the same things everyday." ? ?4) Obesity in pregnancy  ?  ?Meds: No orders of the defined types were placed in this encounter. ? ?Labs/procedures today: none ? ?Plan:  Continue routine obstetrical care  ? ?Reviewed: Term labor symptoms and general obstetric precautions including but not limited to vaginal bleeding, contractions, leaking of fluid and fetal movement were reviewed in detail with the patient.  All questions were answered. Has home bp cuff. Check bp weekly, let us know if >140/90.  ? ?Follow-up: Return in about 1 week (around 04/10/2022) for Return OB visit. ? ?No orders of the defined types were placed in  this encounter. ? ?Laury Deep MSN, CNM ?04/03/2022 ?11:15 AM ?

## 2022-04-03 NOTE — Patient Instructions (Signed)
Alternatives for Labor Prep ? ?Evening Primrose Oil ?It is recommended that you take 1000 -2000 mg; usually divided. Take one capsule by mouth every morning. Then every night at bedtime, pierce one end with a sterilized pin, insert that end vaginally starting at [redacted] weeks pregnant. ? ?Red Raspberry Leaf Tea ?If you do decide to take raspberry leaf tea, it's recommended that you start when you are about [redacted] weeks pregnant, not before. This will give enough time for it to build up in your body. Begin with one cup a day, gradually increasing to three cups. ? ?The Marathon Oil for Irregular Contractions (to be done at 37 weeks or greater) ?www.themilescircuit.com ?Start practicing the circuit at 37 weeks for 10 minutes per position per day, adding a few minutes each day until you reach the full 90 minutes. You should always have someone help you with each position change. ? ?

## 2022-04-03 NOTE — Progress Notes (Signed)
Pt reports fetal movement with some cramping. Pt reports fasting BG yesterday was 87, she did not check it today. ?

## 2022-04-03 NOTE — Telephone Encounter (Signed)
Preadmission screen  

## 2022-04-10 ENCOUNTER — Ambulatory Visit (INDEPENDENT_AMBULATORY_CARE_PROVIDER_SITE_OTHER): Payer: 59

## 2022-04-10 VITALS — BP 130/83 | HR 94 | Wt 256.0 lb

## 2022-04-10 DIAGNOSIS — O24419 Gestational diabetes mellitus in pregnancy, unspecified control: Secondary | ICD-10-CM

## 2022-04-10 DIAGNOSIS — Z3A38 38 weeks gestation of pregnancy: Secondary | ICD-10-CM

## 2022-04-10 DIAGNOSIS — Z3480 Encounter for supervision of other normal pregnancy, unspecified trimester: Secondary | ICD-10-CM

## 2022-04-10 DIAGNOSIS — O99210 Obesity complicating pregnancy, unspecified trimester: Secondary | ICD-10-CM

## 2022-04-10 NOTE — Progress Notes (Signed)
? ?  PRENATAL VISIT NOTE ? ?Subjective:  ?Janice Kemp is a 21 y.o. G1P0 at [redacted]w[redacted]d being seen today for ongoing prenatal care.  She is currently monitored for the following issues for this high-risk pregnancy and has Supervision of other normal pregnancy, antepartum; Obesity in pregnancy; Echogenic intracardiac focus of fetus on prenatal ultrasound; Gestational diabetes mellitus (GDM) affecting pregnancy, antepartum; and BMI 40.0-44.9, adult ([facility]) on their problem list. ? ?Patient reports intermittent cramping and pelvic pressure.  Contractions: Not present. Vag. Bleeding: None.  Movement: Present. Denies leaking of fluid.  ? ?The following portions of the patient's history were reviewed and updated as appropriate: allergies, current medications, past family history, past medical history, past social history, past surgical history and problem list.  ? ?Objective:  ? ?Vitals:  ? 04/10/22 1007  ?BP: 130/83  ?Pulse: 94  ?Weight: 256 lb (116.1 kg)  ? ? ?Fetal Status: Fetal Heart Rate (bpm): 146 Fundal Height: 40 cm Movement: Present  Presentation: Vertex ? ?General:  Alert, oriented and cooperative. Patient is in no acute distress.  ?Skin: Skin is warm and dry. No rash noted.   ?Cardiovascular: Normal heart rate noted  ?Respiratory: Normal respiratory effort, no problems with respiration noted  ?Abdomen: Soft, gravid, appropriate for gestational age.  Pain/Pressure: Present     ?Pelvic: Cervical exam performed in the presence of a chaperone Dilation: 3 Effacement (%): 50 Station: Ballotable  ?Extremities: Normal range of motion.  Edema: None  ?Mental Status: Normal mood and affect. Normal behavior. Normal judgment and thought content.  ? ?Assessment and Plan:  ?Pregnancy: G1P0 at [redacted]w[redacted]d ?1. Supervision of other normal pregnancy, antepartum ?- Routine OB. Doing well. ?- Has been using EPO and RRT. Requesting cervical exam today ?- Strict labor precautions and reviewed location of MAU ?- IOL scheduled for 4/19 ? ?2.  Gestational diabetes mellitus (GDM) affecting pregnancy, antepartum ?- Did not bring log, but reports fasting numbers in the 70's and 2hr PP numbers low 100's ?- 3/30: EFW 2821g, 47%tile ? ?3. [redacted] weeks gestation of pregnancy ? ? ?4. Obesity in pregnancy ? ? ?Term labor symptoms and general obstetric precautions including but not limited to vaginal bleeding, contractions, leaking of fluid and fetal movement were reviewed in detail with the patient. ?Please refer to After Visit Summary for other counseling recommendations.  ? ?Return in about 1 week (around 04/17/2022). ? ?Future Appointments  ?Date Time Provider [place]  ?04/16/2022  7:00 AM [facility] SCHED ROOM [facility] None  ?04/17/2022  9:55 AM Laury Deep, CNM [facility] None  ? ? ?Renee Harder, CNM ? ?

## 2022-04-13 ENCOUNTER — Inpatient Hospital Stay (HOSPITAL_COMMUNITY): Payer: 59 | Admitting: Anesthesiology

## 2022-04-13 ENCOUNTER — Inpatient Hospital Stay (HOSPITAL_COMMUNITY)
Admission: AD | Admit: 2022-04-13 | Discharge: 2022-04-15 | DRG: 805 | Disposition: A | Discharge status: Home / Self Care | Payer: 59 | Attending: Obstetrics and Gynecology | Admitting: Obstetrics and Gynecology

## 2022-04-13 ENCOUNTER — Encounter (HOSPITAL_COMMUNITY): Payer: Self-pay | Admitting: Obstetrics & Gynecology

## 2022-04-13 ENCOUNTER — Other Ambulatory Visit: Payer: Self-pay | Admitting: Obstetrics and Gynecology

## 2022-04-13 ENCOUNTER — Other Ambulatory Visit: Payer: Self-pay

## 2022-04-13 DIAGNOSIS — O326XX1 Maternal care for compound presentation, fetus 1: Secondary | ICD-10-CM | POA: Diagnosis not present

## 2022-04-13 DIAGNOSIS — O24420 Gestational diabetes mellitus in childbirth, diet controlled: Secondary | ICD-10-CM | POA: Diagnosis present

## 2022-04-13 DIAGNOSIS — O99214 Obesity complicating childbirth: Secondary | ICD-10-CM | POA: Diagnosis present

## 2022-04-13 DIAGNOSIS — O99210 Obesity complicating pregnancy, unspecified trimester: Secondary | ICD-10-CM | POA: Diagnosis present

## 2022-04-13 DIAGNOSIS — O26893 Other specified pregnancy related conditions, third trimester: Secondary | ICD-10-CM | POA: Diagnosis present

## 2022-04-13 DIAGNOSIS — O411230 Chorioamnionitis, third trimester, not applicable or unspecified: Secondary | ICD-10-CM | POA: Diagnosis present

## 2022-04-13 DIAGNOSIS — O283 Abnormal ultrasonic finding on antenatal screening of mother: Principal | ICD-10-CM

## 2022-04-13 DIAGNOSIS — O4292 Full-term premature rupture of membranes, unspecified as to length of time between rupture and onset of labor: Principal | ICD-10-CM | POA: Diagnosis present

## 2022-04-13 DIAGNOSIS — Z3480 Encounter for supervision of other normal pregnancy, unspecified trimester: Secondary | ICD-10-CM

## 2022-04-13 DIAGNOSIS — Z3A38 38 weeks gestation of pregnancy: Secondary | ICD-10-CM | POA: Diagnosis not present

## 2022-04-13 DIAGNOSIS — O4290 Premature rupture of membranes, unspecified as to length of time between rupture and onset of labor, unspecified weeks of gestation: Secondary | ICD-10-CM | POA: Diagnosis present

## 2022-04-13 DIAGNOSIS — O4212 Full-term premature rupture of membranes, onset of labor more than 24 hours following rupture: Secondary | ICD-10-CM | POA: Diagnosis not present

## 2022-04-13 DIAGNOSIS — O24419 Gestational diabetes mellitus in pregnancy, unspecified control: Secondary | ICD-10-CM | POA: Diagnosis present

## 2022-04-13 DIAGNOSIS — O411290 Chorioamnionitis, unspecified trimester, not applicable or unspecified: Secondary | ICD-10-CM | POA: Diagnosis not present

## 2022-04-13 LAB — GLUCOSE, CAPILLARY
Glucose-Capillary: 87 mg/dL (ref 70–99)
Glucose-Capillary: 97 mg/dL (ref 70–99)
Glucose-Capillary: 97 mg/dL (ref 70–99)
Glucose-Capillary: 79 mg/dL (ref 70–99)
Glucose-Capillary: 103 mg/dL — ABNORMAL HIGH (ref 70–99)

## 2022-04-13 LAB — CBC
HCT: 33.8 % — ABNORMAL LOW (ref 36.0–46.0)
Hemoglobin: 11.2 g/dL — ABNORMAL LOW (ref 12.0–15.0)
MCH: 28.6 pg (ref 26.0–34.0)
MCHC: 33.1 g/dL (ref 30.0–36.0)
MCV: 86.4 fL (ref 80.0–100.0)
Platelets: 278 10*3/uL (ref 150–400)
RBC: 3.91 MIL/uL (ref 3.87–5.11)
RDW: 14.8 % (ref 11.5–15.5)
WBC: 12.9 10*3/uL — ABNORMAL HIGH (ref 4.0–10.5)
nRBC: 0 % (ref 0.0–0.2)

## 2022-04-13 LAB — TYPE AND SCREEN
ABO/RH(D): O POS
Antibody Screen: NEGATIVE

## 2022-04-13 LAB — POCT FERN TEST: POCT Fern Test: POSITIVE

## 2022-04-13 LAB — RPR: RPR Ser Ql: NONREACTIVE

## 2022-04-13 MED ORDER — MISOPROSTOL 50MCG HALF TABLET: 50.0000 ug | ORAL_TABLET | ORAL | Status: DC | PRN

## 2022-04-13 MED ORDER — LACTATED RINGERS IV SOLN
INTRAVENOUS | Status: DC
Start: 2022-04-13 — End: 2022-04-15

## 2022-04-13 MED ORDER — OXYCODONE-ACETAMINOPHEN 5-325 MG PO TABS: 1.0000 | ORAL_TABLET | ORAL | Status: DC | PRN

## 2022-04-13 MED ORDER — LIDOCAINE HCL (PF) 1 % IJ SOLN: 30.0000 mL | INTRAMUSCULAR | Status: DC | PRN

## 2022-04-13 MED ORDER — OXYTOCIN 10 UNIT/ML IJ SOLN
10.0000 [IU] | Freq: Once | INTRAMUSCULAR | Status: DC | PRN
  Filled 2022-04-13: qty 1

## 2022-04-13 MED ORDER — SOD CITRATE-CITRIC ACID 500-334 MG/5ML PO SOLN: 30.0000 mL | ORAL | Status: DC | PRN

## 2022-04-13 MED ORDER — OXYTOCIN-SODIUM CHLORIDE 30-0.9 UT/500ML-% IV SOLN
1.0000 m[IU]/min | INTRAVENOUS | Status: DC
  Filled 2022-04-13: qty 500

## 2022-04-13 MED ORDER — OXYTOCIN-SODIUM CHLORIDE 30-0.9 UT/500ML-% IV SOLN
2.5000 [IU]/h | INTRAVENOUS | Status: DC
Start: 2022-04-13 — End: 2022-04-14
  Filled 2022-04-13: qty 500

## 2022-04-13 MED ORDER — FENTANYL-BUPIVACAINE-NACL 0.5-0.125-0.9 MG/250ML-% EP SOLN
12.0000 mL/h | EPIDURAL | Status: DC | PRN
  Filled 2022-04-13: qty 250

## 2022-04-13 MED ORDER — FENTANYL CITRATE (PF) 100 MCG/2ML IJ SOLN
100.0000 ug | INTRAMUSCULAR | Status: DC | PRN
  Administered 2022-04-13 (×2): 100 ug via INTRAVENOUS
  Filled 2022-04-13 (×2): qty 2

## 2022-04-13 MED ORDER — OXYTOCIN-SODIUM CHLORIDE 30-0.9 UT/500ML-% IV SOLN
1.0000 m[IU]/min | INTRAVENOUS | Status: DC
  Administered 2022-04-13: 2 m[IU]/min via INTRAVENOUS

## 2022-04-13 MED ORDER — EPHEDRINE 5 MG/ML INJ: 10.0000 mg | INTRAVENOUS | Status: DC | PRN

## 2022-04-13 MED ORDER — PHENYLEPHRINE 40 MCG/ML (10ML) SYRINGE FOR IV PUSH (FOR BLOOD PRESSURE SUPPORT)
80.0000 ug | PREFILLED_SYRINGE | INTRAVENOUS | Status: DC | PRN
  Filled 2022-04-13: qty 10

## 2022-04-13 MED ORDER — FENTANYL-BUPIVACAINE-NACL 0.5-0.125-0.9 MG/250ML-% EP SOLN
EPIDURAL | Status: DC | PRN
  Administered 2022-04-13: 12 mL/h via EPIDURAL

## 2022-04-13 MED ORDER — LACTATED RINGERS IV SOLN: 500.0000 mL | INTRAVENOUS | Status: DC | PRN

## 2022-04-13 MED ORDER — DIPHENHYDRAMINE HCL 50 MG/ML IJ SOLN: 12.5000 mg | INTRAMUSCULAR | Status: DC | PRN

## 2022-04-13 MED ORDER — OXYCODONE-ACETAMINOPHEN 5-325 MG PO TABS: 2.0000 | ORAL_TABLET | ORAL | Status: DC | PRN

## 2022-04-13 MED ORDER — PHENYLEPHRINE 40 MCG/ML (10ML) SYRINGE FOR IV PUSH (FOR BLOOD PRESSURE SUPPORT): 80.0000 ug | PREFILLED_SYRINGE | INTRAVENOUS | Status: DC | PRN

## 2022-04-13 MED ORDER — LACTATED RINGERS IV SOLN: 500.0000 mL | Freq: Once | INTRAVENOUS | Status: DC

## 2022-04-13 MED ORDER — ONDANSETRON HCL 4 MG/2ML IJ SOLN: 4.0000 mg | Freq: Four times a day (QID) | INTRAMUSCULAR | Status: DC | PRN

## 2022-04-13 MED ORDER — ACETAMINOPHEN 325 MG PO TABS: 650.0000 mg | ORAL_TABLET | ORAL | Status: DC | PRN

## 2022-04-13 MED ORDER — TERBUTALINE SULFATE 1 MG/ML IJ SOLN: 0.2500 mg | Freq: Once | INTRAMUSCULAR | Status: DC | PRN

## 2022-04-13 MED ORDER — OXYTOCIN BOLUS FROM INFUSION
333.0000 mL | Freq: Once | INTRAVENOUS | Status: AC
Start: 2022-04-13 — End: 2022-04-14
  Administered 2022-04-14: 333 mL via INTRAVENOUS

## 2022-04-13 MED ORDER — LIDOCAINE HCL (PF) 1 % IJ SOLN
INTRAMUSCULAR | Status: DC | PRN
  Administered 2022-04-13: 5 mL via EPIDURAL

## 2022-04-13 NOTE — MAU Note (Signed)
Pt out of bathroom-external monitors applied ?

## 2022-04-13 NOTE — H&P (Signed)
Janice Kemp is a 21 y.o. female, G1P0 at 38.4 weeks, presenting for SROM.  She reports onset of contractions at 0130 with SROM upon arrival at 0340.  Patient endorses fetal movement and denies vaginal bleeding. Patient receives care at [facility] and was supervised for a high risk pregnancy. Pregnancy and medical history significant for problems as listed below. She is GBS negative and expresses a desire for an Epidural for pain management.  She is anticipating a female infant, for circumcision, and requests Nexplanon for PP birth control method.   ? ? ?Patient Active Problem List  ? Diagnosis Date Noted  ? BMI 40.0-44.9, adult ([facility]) 03/20/2022  ? Gestational diabetes mellitus (GDM) affecting pregnancy, antepartum 02/20/2022  ? Echogenic intracardiac focus of fetus on prenatal ultrasound 11/29/2021  ? Obesity in pregnancy 10/12/2021  ? Supervision of other normal pregnancy, antepartum 08/26/2021  ? ? ?History of present pregnancy: ? ?Last evaluation:  April 10, 2022 by D. Lodema Hong, CNM  ?Cervical Exam: Dilation: 3 Effacement (%): 50 Station: Ballotable ?BP: 130/83  ?Pulse: 94  ?Weight: 256 lb (116.1 kg)  ?  ? ?Nursing Staff Provider  ?Office Location  [facility] Dating  5 week [place]  ?Language  English Anatomy US  ECF, f/u scans ordered  ?Flu Vaccine  Declined 08/26/21 Genetic/Carrier Screen  NIPS:  Low risk female ?AFP:   Declined ?Horizon: Neg  ?TDaP Vaccine   02/20/22 Hgb A1C or  ?GTT Early: Normal at 4.9 ?Third trimester GDM - Failed  ?COVID Vaccine Not vaccinated   LAB RESULTS   ?Rhogam   NA Blood Type O/Positive/-- (10/13 7782)   ?Baby Feeding Plan Breast Antibody Negative (10/13 0902)  ?Contraception Nexplanon Rubella 5.68 (10/13 4235)  ?Circumcision Yes RPR Non Reactive (02/07 1118)   ?Pediatrician  Undecided HBsAg Negative (10/13 0902)   ?Support Person Mark-FOB and Mom HCVAb Negative  ?Prenatal Classes no HIV Non Reactive (02/07 1118)     ?BTL Consent NA GBS Negative/-- (03/31 1032) ?(For PCN allergy, check  sensitivities)   ?VBAC Consent NA Pap  ASCUS HPV - 10/10/21  ?     ?DME Rx Patient instructed to purchase Waterbirth  [ ]  Class [ ]  Consent [ ]  CNM visit  ?PHQ9 & GAD7 [ X ] new OB ?[ X ] 28 weeks  ?x  ] 36 weeks Induction  [ ]  Orders Entered [ ] Foley Y/N  ? ? ?OB History   ? ? Gravida  ?1  ? Para  ?   ? Term  ?   ? Preterm  ?   ? AB  ?   ? Living  ?   ?  ? ? SAB  ?   ? IAB  ?   ? Ectopic  ?   ? Multiple  ?   ? Live Births  ?   ?   ?  ?  ? ? ? ? ?Past Medical History:  ?Diagnosis Date  ? Gestational diabetes   ? ?Past Surgical History:  ?Procedure Laterality Date  ? TONSILECTOMY/ADENOIDECTOMY WITH MYRINGOTOMY  2005  ? ?Family History: family history includes Hypertension in her maternal grandmother and mother. ?Social History:  reports that she has never smoked. She has never used smokeless tobacco. She reports that she does not currently use alcohol. She reports that she does not currently use drugs after having used the following drugs: Marijuana. ? ? ?Prenatal Transfer Tool  ?Maternal Diabetes: Yes:  Diabetes Type:  Diet controlled ?Genetic Screening: Normal ?Maternal Ultrasounds/Referrals: Normal ?Fetal Ultrasounds or  other Referrals:  Referred to Materal Fetal Medicine  d/t GDM ?Maternal Substance Abuse:  No ?Significant Maternal Medications:  None ?Significant Maternal Lab Results: Group B Strep negative ? ? ?Maternal Assessment: ? ?ROS: +Contractions, +LOF, -Vaginal Bleeding, ?+Fetal Movement ? ?All other systems reviewed and negative.  ? ? ?No Known Allergies ?  ?Blood pressure 131/77, pulse (!) 101, temperature 98.6 ?F (37 ?C), temperature source Oral, resp. rate 18, height 5\' 6"  (1.676 m), weight 116 kg, last menstrual period 06/30/2021, SpO2 98 %. ? ?Physical Exam ?Vitals reviewed. Exam conducted with a chaperone present.  ?Constitutional:   ?   General: She is in acute distress (With contractions).  ?   Appearance: Normal appearance. She is obese.  ?HENT:  ?   Head: Normocephalic and atraumatic.  ?Eyes:   ?   Conjunctiva/sclera: Conjunctivae normal.  ?Cardiovascular:  ?   Rate and Rhythm: Normal rate.  ?Abdominal:  ?   Palpations: Abdomen is soft.  ?   Comments: Gravid, Ctx palpated mild to moderate  ?Musculoskeletal:     ?   General: Normal range of motion.  ?   Cervical back: Normal range of motion.  ?Skin: ?   General: Skin is warm and dry.  ?Neurological:  ?   Mental Status: She is alert and oriented to person, place, and time.  ?Psychiatric:     ?   Mood and Affect: Mood normal.     ?   Behavior: Behavior normal.     ?   Thought Content: Thought content normal.  ? ? ?Fetal Assessment: ?Leopolds: ?-Pelvis: Not evaluated ?-EFW: 7lbs  ?-Presentation: Vertex ? ?FHR: 135 bpm, Mod Var, -Decels, +Accels ?UCs:  Q3-4 min, Palpates mild to moderate ? ?  ?Assessment ?IUP at 38.4 weeks ?Cat I FT ?SROM ?GDM-A1 ?Obesity ?GBS Negative ? ? ?Plan: ?Admit to 08/31/2021  ?Routine Labor and Delivery Orders per Protocol ?In room to complete assessment and discuss POC: ?Vertex confirmed by BSUS. ?Discussed expectant management at current interval. ?Will plan for augmentation as appropriate. ?Collect initial CBG and then every 4 hours as ordered. ? ? ?[employer], MSN ?04/13/2022, 4:38 AM ? ? ? ? ?

## 2022-04-13 NOTE — MAU Note (Addendum)
.  Janice Kemp is a 21 y.o. at [redacted]w[redacted]d here in MAU reporting: SROM as pt entered triage-clear fluid-fern collected. Pt reports contractions started at 0100. Pt denies bleeding or bloody show. Endorses + fetal movement ? ?Onset of complaint: 0100 ?Pain score: 5 ?Vitals:  ? 04/13/22 0359  ?BP: 131/77  ?Pulse: 98  ?Resp: 18  ?Temp: 98.6 ?F (37 ?C)  ?SpO2: 100%  ?   ?FHT: 141bpm ?Lab orders placed from triage:   mau labor ?

## 2022-04-13 NOTE — Anesthesia Preprocedure Evaluation (Signed)
Anesthesia Evaluation  ?Patient identified by MRN, date of birth, ID band ?Patient awake ? ? ? ?Reviewed: ?Allergy & Precautions, NPO status , Patient's Chart, lab work & pertinent test results ? ?Airway ?Mallampati: II ? ?TM Distance: >3 FB ?Neck ROM: Full ? ? ? Dental ?no notable dental hx. ?(+) Teeth Intact, Dental Advisory Given ?  ?Pulmonary ?neg pulmonary ROS,  ?  ?Pulmonary exam normal ?breath sounds clear to auscultation ? ? ? ? ? ? Cardiovascular ?negative cardio ROS ?Normal cardiovascular exam ?Rhythm:Regular Rate:Normal ? ? ?  ?Neuro/Psych ?negative neurological ROS ? negative psych ROS  ? GI/Hepatic ?negative GI ROS, Neg liver ROS,   ?Endo/Other  ?diabetes, Gestational ? Renal/GU ?negative Renal ROS  ? ?  ?Musculoskeletal ?negative musculoskeletal ROS ?(+)  ? Abdominal ?(+) + obese (BMI 41.3),   ?Peds ? Hematology ?Lab Results ?     Component                Value               Date                 ?     WBC                      12.9 (H)            04/13/2022           ?     HGB                      11.2 (L)            04/13/2022           ?     HCT                      33.8 (L)            04/13/2022           ?     MCV                      86.4                04/13/2022           ?     PLT                      278                 04/13/2022           ?   ?Anesthesia Other Findings ? ? Reproductive/Obstetrics ?(+) Pregnancy ? ?  ? ? ? ? ? ? ? ? ? ? ? ? ? ?  ?  ? ? ? ? ? ? ? ? ?Anesthesia Physical ?Anesthesia Plan ? ?ASA: 3 ? ?Anesthesia Plan: Epidural  ? ?Post-op Pain Management:   ? ?Induction:  ? ?PONV Risk Score and Plan:  ? ?Airway Management Planned:  ? ?Additional Equipment:  ? ?Intra-op Plan:  ? ?Post-operative Plan:  ? ?Informed Consent: I have reviewed the patients History and Physical, chart, labs and discussed the procedure including the risks, benefits and alternatives for the proposed anesthesia with the patient or authorized representative who has indicated  his/her understanding and acceptance.  ? ? ? ? ? ?Plan Discussed with:  ? ?Anesthesia Plan Comments: (  38.4 wk primagravida w  GDM and BMI 41.3 for LEA)  ? ? ? ? ? ? ?Anesthesia Quick Evaluation ? ?

## 2022-04-13 NOTE — Anesthesia Procedure Notes (Signed)
Epidural ?Patient location during procedure: OB ?Start time: 04/13/2022 4:46 PM ?End time: 04/13/2022 5:05 PM ? ?Staffing ?Anesthesiologist: Trevor Iha, MD ?Performed: anesthesiologist  ? ?Preanesthetic Checklist ?Completed: patient identified, IV checked, site marked, risks and benefits discussed, surgical consent, monitors and equipment checked, pre-op evaluation and timeout performed ? ?Epidural ?Patient position: sitting ?Prep: DuraPrep and site prepped and draped ?Patient monitoring: continuous pulse ox and blood pressure ?Approach: midline ?Injection technique: LOR air ? ?Needle:  ?Needle type: Tuohy  ?Needle gauge: 17 G ?Needle length: 9 cm and 9 ?Needle insertion depth: 9 cm ?Catheter type: closed end flexible ?Catheter size: 19 Gauge ?Catheter at skin depth: 14 cm ?Test dose: negative ? ?Assessment ?Events: blood not aspirated, injection not painful, no injection resistance, no paresthesia and negative IV test ? ?Additional Notes ?Patient identified. Risks/Benefits/Options discussed with patient including but not limited to bleeding, infection, nerve damage, paralysis, failed block, incomplete pain control, headache, blood pressure changes, nausea, vomiting, reactions to medication both or allergic, itching and postpartum back pain. Confirmed with bedside nurse the patient's most recent platelet count. Confirmed with patient that they are not currently taking any anticoagulation, have any bleeding history or any family history of bleeding disorders. Patient expressed understanding and wished to proceed. All questions were answered. Sterile technique was used throughout the entire procedure. Please see nursing notes for vital signs. Test dose was given through epidural needle and negative prior to continuing to dose epidural or start infusion. Warning signs of high block given to the patient including shortness of breath, tingling/numbness in hands, complete motor block, or any concerning symptoms with  instructions to call for help. Patient was given instructions on fall risk and not to get out of bed. All questions and concerns addressed with instructions to call with any issues.  2 Attempt (S) . Patient tolerated procedure well. ? ? ? ?

## 2022-04-13 NOTE — Progress Notes (Signed)
Janice Kemp ?MRN: [id number] ? ?Subjective: ?-Patient resting in bed. Reports contractions are stronger than when she arrived. Requesting vaginal exam to check progress.  ? ?Objective: ?BP 128/79   Pulse 87   Temp 98.7 ?F (37.1 ?C) (Oral)   Resp 18   Ht 5\' 6"  (1.676 m)   Wt 116 kg   LMP 06/30/2021   SpO2 98%   BMI 41.28 kg/m?  ?No intake/output data recorded. ?No intake/output data recorded. ? ?Fetal Monitoring: ?FHT: 125 bpm, Mod Var, -Decels, +Accels ?UC: Irritability   ? ?Vaginal Exam: ?SVE:   Dilation: 3 ?Effacement (%): 90 ?Station: 0 ?Exam by:: Janice Kemp, CNM ?Membranes:SROM x 4 hrs ?Internal Monitors: None ? ?Augmentation/Induction: ?Pitocin:None ?Cytotec: None ? ?Assessment:  ?IUP at 38.4 weeks ?Cat I FT  ?SROM ? ?Plan: ?-Discussed continued expectant management. ?-Cautioned that should allow for evaluation every 3-4 hours to evaluate need for augmentation. ?-Educated on concerns related to prolonged ROM. ?-Patient verbalizes understanding ?-Support given for desire to have low intervention birth. ?-Continue other mgmt as ordered ? ? ?Riley Churches, CNM ?Energy manager, [facility] [employer] ?04/13/2022, 7:32 AM ? ? ?

## 2022-04-13 NOTE — Progress Notes (Addendum)
Labor Progress Note ?Janice Kemp is a 21 y.o. G1P0 at [redacted]w[redacted]d presented for SROM and SOL ? ?S: Patient is doing well overall, she is feeling like she is having contractions almost every 6 minutes.   ? ?O:  ?BP 135/66   Pulse 93   Temp 98.8 ?F (37.1 ?C) (Oral)   Resp 20   Ht 5\' 6"  (1.676 m)   Wt 116 kg   LMP 06/30/2021   SpO2 98%   BMI 41.28 kg/m?  ?EFM: baseline 130/moderate variability/+ accels, no decels  ? ?CVE: Dilation: 3.5 ?Effacement (%): 90, 100 ?Station: -1 ?Presentation: Vertex ?Exam by:: Mazey Mantell, CNM ? ? ?A&P: 21 y.o. G1P0 [redacted]w[redacted]d  ? ?#Labor: Progressing well. Patient is ready to start Pitocin at this time.  Will start Pitocin 2x2 and continue to monitor.   ?#Pain: PRN ?#FWB: Cat 1 ?#GBS negative ? ?#GDM ?-- BG WNL ?-- Q4H checks  ?-- Continue to monitor  ? ?[redacted]w[redacted]d, DO, PGY-1 ?11:23 AM  ? ? ?Attestation of CNM Supervision of Resident: Evaluation and management procedures were performed by the [facility] Medicine Resident under my supervision. I was immediately available for direct supervision, assistance and direction throughout this encounter.  I also confirm that I have verified the information documented in the resident?s note, and that I have also personally reperformed the pertinent components of the physical exam and all of the medical decision making activities.  I have also made any necessary editorial changes. ? ?[facility], CNM ?04/13/2022 11:44 AM ? ? ?

## 2022-04-13 NOTE — Progress Notes (Addendum)
Labor Progress Note ?Janice Kemp is a 21 y.o. G1P0 at [redacted]w[redacted]d presented for SROM and SOL ? ?S: Patient resting comfortably, she is feeling a lot better since the epidural placement and mentions the sensation of needing to poop.   ? ?O:  ?BP 121/69   Pulse 99   Temp 99.1 ?F (37.3 ?C) (Oral)   Resp 18   Ht 5\' 6"  (1.676 m)   Wt 116 kg   LMP 06/30/2021   SpO2 98%   BMI 41.28 kg/m?  ?EFM: baseline 130/moderate variability/+ accels, no decels  ? ?CVE: Dilation: 9 ?Effacement (%): 100 ?Station: 0 ?Presentation: Vertex ?Exam by:: Json Koelzer, CNM ? ? ?A&P: 21 y.o. G1P0 [redacted]w[redacted]d  ? ?#Labor: Progressing well on Pitocin ?#Pain: Epidural ?#FWB: Cat 1 ?#GBS negative ? ?#GDM ?-- BG WNL ?-- Q4H checks  ?-- Continue to monitor  ? ?[redacted]w[redacted]d, DO, PGY-1 ? ? ?Attestation of CNM Supervision of Resident: Evaluation and management procedures were performed by the [facility] Medicine Resident under my supervision. I was immediately available for direct supervision, assistance and direction throughout this encounter.  I also confirm that I have verified the information documented in the resident?s note, and that I have also personally reperformed the pertinent components of the physical exam and all of the medical decision making activities.  I have also made any necessary editorial changes. ? ?[facility], CNM ?04/13/2022 8:30 PM ? ? ? ?

## 2022-04-13 NOTE — Progress Notes (Signed)
Labor Progress Note ?Allura Doepke is a 21 y.o. G1P0 at [redacted]w[redacted]d presented for SROM and SOL ? ?S: Patient reports more painful, frequent contractions. Received 1 dose of IV fentanyl about 1 hour ago and is requesting another dose.   ? ?O:  ?BP (!) 117/56   Pulse 73   Temp 97.8 ?F (36.6 ?C) (Oral)   Resp 20   Ht 5\' 6"  (1.676 m)   Wt 116 kg   LMP 06/30/2021   SpO2 98%   BMI 41.28 kg/m?  ?EFM: baseline 125/moderate variability/+ accels, no decels  ? ?CVE: Dilation: 5 ?Effacement (%): 90, 100 ?Station: -1, 0 ?Presentation: Vertex ?Exam by:: Parag Dorton, CNM ? ? ?A&P: 21 y.o. G1P0 [redacted]w[redacted]d  ? ?#Labor: Progressing well on Pitocin. Attempted to place IUPC as it has been difficult to trace contractions, however unable to advance IUPC d/t fetal station/position. Patient requesting to hold off on IUPC placement until she is more comfortable.  ?#Pain: PRN ?#FWB: Cat 1 ?#GBS negative ? ?#GDM ?-- BG WNL ?-- Q4H checks  ?-- Continue to monitor  ? ?[redacted]w[redacted]d, CNM ? ?

## 2022-04-14 ENCOUNTER — Encounter (HOSPITAL_COMMUNITY): Payer: Self-pay | Admitting: Family Medicine

## 2022-04-14 DIAGNOSIS — O4212 Full-term premature rupture of membranes, onset of labor more than 24 hours following rupture: Secondary | ICD-10-CM

## 2022-04-14 DIAGNOSIS — Z3A38 38 weeks gestation of pregnancy: Secondary | ICD-10-CM

## 2022-04-14 DIAGNOSIS — O411230 Chorioamnionitis, third trimester, not applicable or unspecified: Secondary | ICD-10-CM

## 2022-04-14 DIAGNOSIS — O326XX1 Maternal care for compound presentation, fetus 1: Secondary | ICD-10-CM

## 2022-04-14 DIAGNOSIS — O411290 Chorioamnionitis, unspecified trimester, not applicable or unspecified: Secondary | ICD-10-CM | POA: Diagnosis not present

## 2022-04-14 DIAGNOSIS — O24420 Gestational diabetes mellitus in childbirth, diet controlled: Secondary | ICD-10-CM

## 2022-04-14 LAB — GLUCOSE, CAPILLARY: Glucose-Capillary: 96 mg/dL (ref 70–99)

## 2022-04-14 MED ORDER — PRENATAL MULTIVITAMIN CH
1.0000 | ORAL_TABLET | Freq: Every day | ORAL | Status: DC
  Administered 2022-04-14: 1 via ORAL
  Filled 2022-04-14: qty 1

## 2022-04-14 MED ORDER — DIPHENHYDRAMINE HCL 25 MG PO CAPS: 25.0000 mg | ORAL_CAPSULE | Freq: Four times a day (QID) | ORAL | Status: DC | PRN

## 2022-04-14 MED ORDER — ONDANSETRON HCL 4 MG/2ML IJ SOLN: 4.0000 mg | INTRAMUSCULAR | Status: DC | PRN

## 2022-04-14 MED ORDER — COCONUT OIL OIL
1.0000 | TOPICAL_OIL | Status: DC | PRN
Start: 2022-04-14 — End: 2022-04-15

## 2022-04-14 MED ORDER — TRANEXAMIC ACID-NACL 1000-0.7 MG/100ML-% IV SOLN
INTRAVENOUS | Status: AC
  Filled 2022-04-14: qty 100

## 2022-04-14 MED ORDER — ACETAMINOPHEN 325 MG PO TABS: 650.0000 mg | ORAL_TABLET | ORAL | Status: DC | PRN

## 2022-04-14 MED ORDER — SODIUM CHLORIDE 0.9 % IV SOLN
2.0000 g | Freq: Four times a day (QID) | INTRAVENOUS | Status: DC
  Administered 2022-04-14: 2 g via INTRAVENOUS
  Filled 2022-04-14: qty 2000

## 2022-04-14 MED ORDER — WITCH HAZEL-GLYCERIN EX PADS: 1.0000 "application " | MEDICATED_PAD | CUTANEOUS | Status: DC | PRN

## 2022-04-14 MED ORDER — METHYLERGONOVINE MALEATE 0.2 MG/ML IJ SOLN
INTRAMUSCULAR | Status: AC
  Filled 2022-04-14: qty 1

## 2022-04-14 MED ORDER — IBUPROFEN 600 MG PO TABS
600.0000 mg | ORAL_TABLET | Freq: Four times a day (QID) | ORAL | Status: DC
  Administered 2022-04-14 – 2022-04-15 (×5): 600 mg via ORAL
  Filled 2022-04-14 (×6): qty 1

## 2022-04-14 MED ORDER — TRANEXAMIC ACID-NACL 1000-0.7 MG/100ML-% IV SOLN
1000.0000 mg | INTRAVENOUS | Status: AC
  Administered 2022-04-14: 1000 mg via INTRAVENOUS

## 2022-04-14 MED ORDER — METHYLERGONOVINE MALEATE 0.2 MG/ML IJ SOLN
0.2000 mg | Freq: Once | INTRAMUSCULAR | Status: AC
  Administered 2022-04-14: 0.2 mg via INTRAMUSCULAR

## 2022-04-14 MED ORDER — GENTAMICIN SULFATE 40 MG/ML IJ SOLN
5.0000 mg/kg | Freq: Once | INTRAMUSCULAR | Status: DC
  Administered 2022-04-14: 410 mg via INTRAVENOUS
  Filled 2022-04-14: qty 10.25

## 2022-04-14 MED ORDER — SIMETHICONE 80 MG PO CHEW: 80.0000 mg | CHEWABLE_TABLET | ORAL | Status: DC | PRN

## 2022-04-14 MED ORDER — BENZOCAINE-MENTHOL 20-0.5 % EX AERO
1.0000 "application " | INHALATION_SPRAY | CUTANEOUS | Status: DC | PRN
  Administered 2022-04-14: 1 via TOPICAL
  Filled 2022-04-14: qty 56

## 2022-04-14 MED ORDER — SENNOSIDES-DOCUSATE SODIUM 8.6-50 MG PO TABS: 2.0000 | ORAL_TABLET | Freq: Every day | ORAL | Status: DC

## 2022-04-14 MED ORDER — DIBUCAINE (PERIANAL) 1 % EX OINT: 1.0000 "application " | TOPICAL_OINTMENT | CUTANEOUS | Status: DC | PRN

## 2022-04-14 MED ORDER — SODIUM CHLORIDE 0.9 % IV SOLN: 2.0000 g | Freq: Once | INTRAVENOUS | Status: DC

## 2022-04-14 MED ORDER — ONDANSETRON HCL 4 MG PO TABS: 4.0000 mg | ORAL_TABLET | ORAL | Status: DC | PRN

## 2022-04-14 NOTE — Lactation Note (Signed)
This note was copied from a baby's chart. ?Lactation Consultation Note ? ?Patient Name: Janice Kemp ?Today's Date: 04/14/2022 ?Reason for consult: Initial assessment;Early term 37-38.6wks;Primapara;1st time breastfeeding ?Age:23 hours ? ? ?P1 mother whose infant is now 57 hours old.  This is an ETI at 38+5 weeks.  Mother's current feeding preference is breast. ? ?Baby "Janice Kemp" was asleep STS on mother's chest.  She recently attempted to breast feed; no latch but colostrum drops given.  Encouraged to feed 8-12 times/24 hours or sooner if baby shows cues.  Suggested lots of STS, breast massage and hand expression.  Offered to return for the next feeding as desired.  Mother appreciative. ? ?Grandmother present at the end of my visit.   ? ? ?Maternal Data ?Has patient been taught Hand Expression?: Yes ?Does the patient have breastfeeding experience prior to this delivery?: No ? ?Feeding ?Mother's Current Feeding Choice: Breast Milk ? ?LATCH Score ?Latch: Too sleepy or reluctant, no latch achieved, no sucking elicited. ? ?Audible Swallowing: None ? ?Type of Nipple: Everted at rest and after stimulation ? ?Comfort (Breast/Nipple): Soft / non-tender ? ?Hold (Positioning): Assistance needed to correctly position infant at breast and maintain latch. ? ?LATCH Score: 5 ? ? ?Lactation Tools Discussed/Used ?  ? ?Interventions ?Interventions: Breast feeding basics reviewed;Education;LC Services brochure ? ?Discharge ?  ? ?Consult Status ?Consult Status: Follow-up ?Date: 04/15/22 ?Follow-up type: In-patient ? ? ? ?Ladislaus Repsher R Jayde Mcallister ?04/14/2022, 7:00 AM ? ? ? ?

## 2022-04-14 NOTE — Progress Notes (Signed)
Labor Progress Note ?Janice Kemp is a 21 y.o. G1P0 at [redacted]w[redacted]d presented for SROM and SOL.  ? ?S: Doing well. Continues to feel a lot of pressure with contractions. No concerns at this time.  ? ?O:  ?BP 104/66   Pulse (!) 105   Temp 99.2 ?F (37.3 ?C) (Axillary)   Resp 18   Ht 5\' 6"  (1.676 m)   Wt 116 kg   LMP 06/30/2021   SpO2 98%   BMI 41.28 kg/m?  ? ?EFM: Baseline 145 bpm, moderate variability, + accels, no decels  ? ?CVE: Dilation: Lip/rim ?Effacement (%): 100 ?Station: Plus 1 ?Presentation: Vertex ?Exam by:: Marcene Duos, RN ? ?A&P: 21 y.o. G1P0 [redacted]w[redacted]d  ? ?#Labor: SVE unchanged. Fetal head feels LOT. IUPC placed without difficulty after verbal consent. Will reassess patten and up titrate Pitocin as appropriate. Will work on position changes to help with fetal head rotation and further descent into pelvis.  ?#Pain: Epidural  ?#FWB: Cat 1  ?#GBS negative ? ?Genia Del, MD ?12:15 AM ? ?

## 2022-04-14 NOTE — Discharge Summary (Signed)
? ?  Postpartum Discharge Summary ? ?   ?Patient Name: Janice Kemp ?DOB: 03-25-2001 ?MRN: [id number] ? ?Date of admission: 04/13/2022 ?Delivery date:04/14/2022  ?Delivering provider: Genia Del  ?Date of discharge: 04/15/2022 ? ?Admitting diagnosis: Indication for care in labor or delivery [O75.9] ?Intrauterine pregnancy: [redacted]w[redacted]d    ?Secondary diagnosis:  Principal Problem: ?  Vaginal delivery ?Active Problems: ?  Supervision of other normal pregnancy, antepartum ?  Obesity in pregnancy ?  Gestational diabetes mellitus (GDM) affecting pregnancy, antepartum ?  Premature rupture of membranes ?  Chorioamnionitis ? ?Additional problems: None     ?Discharge diagnosis: Term Pregnancy Delivered                                              ?Post partum procedures: None ?Augmentation: Pitocin ?Complications: Intrauterine Inflammation or infection (Chorioamniotis) ? ?Hospital course: Onset of Labor With Vaginal Delivery      ?21y.o. yo G1P0 at 23w5das admitted in Latent Labor on 04/13/2022 after SROM. Patient progressed to complete after augmentation with Pitocin. Patient developed triple I close to delivery and received Ampicillin/Gentamycin x1. She had a normal vaginal delivery without complication.  ?Membrane Rupture Time/Date: 3:40 AM ,04/13/2022   ?Delivery Method:Vaginal, Spontaneous  ?Episiotomy: None  ?Lacerations:  None  ?Patient had an uncomplicated postpartum course.  She is ambulating, tolerating a regular diet, passing flatus, and urinating well.  She remained afebrile after delivery.  She denies pain and her bleeding is controlled.  She is breastfeeding.  Patient is discharged home in stable condition on 04/15/22. ? ?Newborn Data: ?Birth date:04/14/2022  ?Birth time:3:10 AM  ?Gender:Female  ?Living status:Living  ?Apgars:7 ,9  ?Weight:3060 g  ? ?Magnesium Sulfate received: No ?BMZ received: No ?Rhophylac: N/A ?MMR: N/A ?T-DaP: Given prenatally ?Flu: No ?Transfusion: No ? ?Physical exam  ?Vitals:  ?  04/14/22 1123 04/14/22 1809 04/14/22 2127 04/15/22 0554  ?BP: 128/71 120/62 112/64 126/75  ?Pulse: (!) 103 76 74 84  ?Resp: _0 ?Temp: 98.7 ?F (37.1 ?C) 98 ?F (36.7 ?C) 98.6 ?F (37 ?C)   ?TempSrc: Oral Oral Oral   ?SpO2: 100%     ?Weight:      ?Height:      ? ?General: alert, cooperative, and no distress ?Lochia: appropriate ?Uterine Fundus: firm and below umbilicus  ?DVT Evaluation: no LE edema or calf tenderness to palpation  ? ?Labs: ?Lab Results  ?Component Value Date  ? WBC 12.9 (H) 04/13/2022  ? HGB 11.2 (L) 04/13/2022  ? HCT 33.8 (L) 04/13/2022  ? MCV 86.4 04/13/2022  ? PLT 278 04/13/2022  ? ? ?  Latest Ref Rng & Units 10/10/2021  ?  9:02 AM  ?CMP  ?Glucose 70 - 99 mg/dL 92    ?BUN 6 - 20 mg/dL 6    ?Creatinine 0.57 - 1.00 mg/dL 0.71    ?Sodium 134 - 144 mmol/L 135    ?Potassium 3.5 - 5.2 mmol/L 4.1    ?Chloride 96 - 106 mmol/L 101    ?CO2 20 - 29 mmol/L 19    ?Calcium 8.7 - 10.2 mg/dL 9.9    ?Total Protein 6.0 - 8.5 g/dL 7.2    ?Total Bilirubin 0.0 - 1.2 mg/dL <0.2    ?Alkaline Phos 44 - 121 IU/L 73    ?AST 0 - 40 IU/L 12    ?ALT 0 -  32 IU/L 8    ? ?Edinburgh Score: ? ?  04/14/2022  ?  8:22 PM  ?Edinburgh Postnatal Depression Scale Screening Tool  ?I have been able to laugh and see the funny side of things. 0  ?I have looked forward with enjoyment to things. 0  ?I have blamed myself unnecessarily when things went wrong. 1  ?I have been anxious or worried for no good reason. 0  ?I have felt scared or panicky for no good reason. 1  ?Things have been getting on top of me. 0  ?I have been so unhappy that I have had difficulty sleeping. 0  ?I have felt sad or miserable. 0  ?I have been so unhappy that I have been crying. 1  ?The thought of harming myself has occurred to me. 0  ?Edinburgh Postnatal Depression Scale Total 3  ? ? ? ?After visit meds:  ?Allergies as of 04/15/2022   ?No Known Allergies ?  ? ?  ?Medication List  ?  ? ?STOP taking these medications   ? ?Accu-Chek Guide test strip ?Generic drug:  glucose blood ?  ?Accu-Chek Guide w/Device Kit ?  ?Accu-Chek Softclix Lancets lancets ?  ?EVENING PRIMROSE OIL PO ?  ? ?  ? ?TAKE these medications   ? ?acetaminophen 500 MG tablet ?Commonly known as: TYLENOL ?Take 2 tablets (1,000 mg total) by mouth every 8 (eight) hours as needed (pain). ?  ?ibuprofen 600 MG tablet ?Commonly known as: ADVIL ?Take 1 tablet (600 mg total) by mouth every 6 (six) hours as needed (pain). ?  ?Iron Polysacch Cmplx-B12-FA 150-0.025-1 MG Caps ?Take 1 capsule by mouth every other day. ?  ?PRENATAL VITAMINS PO ?Take 1 tablet by mouth daily. ?  ? ?  ? ? ? ?Discharge home in stable condition ?Infant Feeding: Breast ?Infant Disposition: Rooming in  ?Discharge instruction: per After Visit Summary and Postpartum booklet. ?Activity: Advance as tolerated. Pelvic rest for 6 weeks.  ?Diet: routine diet ?Future Appointments: ?Future Appointments  ?Date Time Provider Department Center  ?06/02/2022  9:15 AM [facility] None  ?06/02/2022 10:55 AM Dawson, Rolitta, CNM [facility] None  ? ?Follow up Visit: ?Message sent to [facility] by Dr. Albert on 04/14/22.  ? ?Please schedule this patient for a In person postpartum visit in 6 weeks with the following provider: Any provider. ?Additional Postpartum F/U: 2 hour GTT  ?High risk pregnancy complicated by: GDM ?Delivery mode:  Vaginal, Spontaneous  ?Anticipated Birth Control:  Nexplanon at postpartum visit  ? ?04/15/2022 ?Christina M Albert, MD ? ? ? ?

## 2022-04-14 NOTE — Anesthesia Postprocedure Evaluation (Signed)
Anesthesia Post Note ? ?Patient: Janice Kemp ? ?Procedure(s) Performed: AN AD HOC LABOR EPIDURAL ? ?  ? ?Patient location during evaluation: Mother Baby ?Anesthesia Type: Epidural ?Level of consciousness: awake and alert and oriented ?Pain management: satisfactory to patient ?Vital Signs Assessment: post-procedure vital signs reviewed and stable ?Respiratory status: respiratory function stable ?Cardiovascular status: stable ?Postop Assessment: no headache, no backache, epidural receding, patient able to bend at knees, no signs of nausea or vomiting, adequate PO intake and able to ambulate ?Anesthetic complications: no ? ? ?No notable events documented. ? ?Last Vitals:  ?Vitals:  ? 04/14/22 0655 04/14/22 1123  ?BP: 114/77 128/71  ?Pulse: 92 (!) 103  ?Resp: 18 18  ?Temp: 37.3 ?C 37.1 ?C  ?SpO2: 100% 100%  ?  ?Last Pain:  ?Vitals:  ? 04/14/22 1123  ?TempSrc: Oral  ?PainSc: 0-No pain  ? ?Pain Goal:   ? ?  ?  ?  ?  ?  ?  ?  ? ?[name] ? ? ? ? ?

## 2022-04-14 NOTE — Lactation Note (Signed)
This note was copied from a baby's chart. ?Lactation Consultation Note ? ?Patient Name: Janice Kemp ?Today's Date: 04/14/2022 ?Reason for consult: L&D Initial assessment;Early term 37-38.6wks;Primapara;1st time breastfeeding ?Age:23 hours ? ? ?Initial L&D Consult: ? ?Visited with family < 1 hour after birth ?Mother had latched prior to my arrival; quiet and awake.  Attempted to latch again, however, he was not interested.  Placed him STS on mother's chest.  Father at bedside. ? ? ?Maternal Data ?  ? ?Feeding ?Mother's Current Feeding Choice: Breast Milk ? ?LATCH Score ?Latch: Too sleepy or reluctant, no latch achieved, no sucking elicited. ? ?Audible Swallowing: None ? ?Type of Nipple: Everted at rest and after stimulation ? ?Comfort (Breast/Nipple): Soft / non-tender ? ?Hold (Positioning): Assistance needed to correctly position infant at breast and maintain latch. ? ?LATCH Score: 5 ? ? ?Lactation Tools Discussed/Used ?  ? ?Interventions ?Interventions: Assisted with latch;Skin to skin ? ?Discharge ?  ? ?Consult Status ?Consult Status: Follow-up from L&D ? ? ? ?Safiyah Cisney R Traveion Ruddock ?04/14/2022, 4:02 AM ? ? ? ?

## 2022-04-15 ENCOUNTER — Ambulatory Visit: Payer: Self-pay

## 2022-04-15 MED ORDER — IBUPROFEN 600 MG PO TABS: 600.0000 mg | ORAL_TABLET | Freq: Four times a day (QID) | ORAL | 0 refills | Status: AC | PRN

## 2022-04-15 MED ORDER — ACETAMINOPHEN 500 MG PO TABS
1000.0000 mg | ORAL_TABLET | Freq: Three times a day (TID) | ORAL | 0 refills | Status: AC | PRN
Start: 2022-04-15 — End: ?

## 2022-04-15 NOTE — Lactation Note (Signed)
This note was copied from a baby's chart. ?Lactation Consultation Note ? ?Patient Name: Janice Kemp ?Today's Date: 04/15/2022 ?Reason for consult: Follow-up assessment;Difficult latch;Hyperbilirubinemia (Infant on phototherpy) ?Age:23 hours, ETI female infant with -3% weight loss. ?Per mom, her nipples are sore and she no longer wants to latch infant at the breast. ?Mom's new feeding choice is pumping only, mom will finish offering infant the 100 mls of donor milk  in fridge she has , before offering formula.  ?Mom doesn't have any abrasions or tissue break down on nipples, LC alert RN to provide mom with coconut oil.  ?Mom has been using wrong flange size with hand pump , LC re-fitted mom with 27 mm breast flange, due Mom's left nipple being sore, she  is only pumping her right breast tonight. ?Mom was pumping while LC was in the room, mom was still pumping her right breast and expressed 7 mls as LC left the room. ?Mom's current plan: ?1- Mom will offer infant her pumped EBM first , then supplement infant with donor breast milk before formula. ?2- Mom understands due infant not latching at breast at  24- 48 hours of life to offer 15-30 mls per feeding and then at 48-72 hours increase volume 30 mls+ per feeding to pace feed infant.  ?3- Mom will pumped every 3 hours for 15 minutes on initial setting and will pump both breast in morning. ?Maternal Data ?  ? ?Feeding ?Mother's Current Feeding Choice: Breast Milk and Donor Milk ? ?LATCH Score ?  ? ?  ? ?  ? ?  ? ?  ? ?  ? ? ?Lactation Tools Discussed/Used ?Tools: Pump;Flanges ?Flange Size: 27 (LC re-fitted mom with 27 m breast flange.) ?Breast pump type: Double-Electric Breast Pump ?Pump Education: Setup, frequency, and cleaning;Milk Storage ?Reason for Pumping: Mom's current feeding choice is " pumping only" offering donor breast milk and then formula. ?Pumping frequency: Mom will pump every 3 hours for 15 minutes and give infant back her EBM first before donor  milk and formula milk. ?Pumped volume: 7 mL (Mom was still pumping her right breast when LC left the room.) ? ?Interventions ?Interventions: DEBP;Pace feeding;Education ? ?Discharge ?  ? ?Consult Status ?Consult Status: Follow-up ?Date: 04/16/22 ?Follow-up type: In-patient ? ? ? ?Danelle Earthly ?04/15/2022, 9:30 PM ? ? ? ?

## 2022-04-16 ENCOUNTER — Inpatient Hospital Stay (HOSPITAL_COMMUNITY): Admission: AD | Admit: 2022-04-16 | Payer: 59 | Source: Home / Self Care | Admitting: Obstetrics and Gynecology

## 2022-04-16 ENCOUNTER — Inpatient Hospital Stay (HOSPITAL_COMMUNITY): Payer: 59

## 2022-04-17 ENCOUNTER — Encounter: Payer: 59 | Admitting: Obstetrics and Gynecology

## 2022-04-24 ENCOUNTER — Telehealth (HOSPITAL_COMMUNITY): Payer: Self-pay | Admitting: *Deleted

## 2022-04-24 NOTE — Telephone Encounter (Signed)
Left phone voicemail message. ? ?Duffy Rhody, RN 04-24-2022 at 1:13pm ?

## 2022-06-02 ENCOUNTER — Encounter: Payer: Self-pay | Admitting: Obstetrics and Gynecology

## 2022-06-02 ENCOUNTER — Other Ambulatory Visit: Payer: 59

## 2022-06-02 ENCOUNTER — Ambulatory Visit (INDEPENDENT_AMBULATORY_CARE_PROVIDER_SITE_OTHER): Payer: 59 | Admitting: Obstetrics and Gynecology

## 2022-06-02 DIAGNOSIS — Z30017 Encounter for initial prescription of implantable subdermal contraceptive: Secondary | ICD-10-CM

## 2022-06-02 DIAGNOSIS — Z8632 Personal history of gestational diabetes: Secondary | ICD-10-CM

## 2022-06-02 MED ORDER — ETONOGESTREL 68 MG ~~LOC~~ IMPL
68.0000 mg | DRUG_IMPLANT | Freq: Once | SUBCUTANEOUS | Status: AC
  Administered 2022-06-02: 68 mg via SUBCUTANEOUS

## 2022-06-02 NOTE — Progress Notes (Signed)
GYNECOLOGY OFFICE PROCEDURE NOTE   Ms. Janice Kemp is a 21 y.o. G1P1001 here for postpartum Nexplanon insertion. No complaints. UPT ordered today.  Ht 5\' 6"  (1.676 m)   Wt 233 lb 12.8 oz (106.1 kg)   LMP 05/28/2022 (Exact Date)   BMI 37.74 kg/m    No results found for this or any previous visit (from the past 24 hour(s)).    Nexplanon Insertion Procedure Patient identified, informed consent performed, consent signed.   Patient does understand that irregular bleeding is a very common side effect of this medication. She was advised to have backup contraception for one week after placement. Pregnancy test in clinic today was negative.  Appropriate time out taken.  Patient's left arm was prepped and draped in the usual sterile fashion. The ruler used to measure and mark insertion area.  Patient was prepped with alcohol swab and then injected with 2 ml of 1% lidocaine.  She was prepped with betadine, Nexplanon removed from packaging,  Device confirmed in needle, then inserted full length of needle and withdrawn per handbook instructions. Nexplanon was able to palpated in the patient's arm; patient palpated the insert herself. There was minimal blood loss.  Patient insertion site covered with guaze and a pressure bandage to reduce any bruising.  The patient tolerated the procedure well and was given post procedure instructions. Follow-up via My Chart video visit , unless having problems and need to be seen in the office.  Nexplanon Lot#: [id number]/30/2022 / Expiration Date: 04/07/2024  06/07/2024, CNM  06/02/2022 9:59 AM

## 2022-06-02 NOTE — Progress Notes (Signed)
Post Partum Visit Note  Janice Kemp is a 21 y.o. G17P1001 female who presents for a postpartum visit. She is 7 weeks postpartum following a normal spontaneous vaginal delivery.  I have fully reviewed the prenatal and intrapartum course. The delivery was at 38 gestational weeks.  Anesthesia: epidural. Postpartum course has been unremarkable. Baby is doing well. Baby is feeding by bottle - Similac Alimentum. Bleeding no bleeding. Bowel function is normal. Bladder function is normal. Patient is not sexually active. Contraception method is Nexplanon. Postpartum depression screening: negative.   Upstream - 06/02/22 0940       Pregnancy Intention Screening   Does the patient want to become pregnant in the next year? No    Does the patient's partner want to become pregnant in the next year? No    Would the patient like to discuss contraceptive options today? Yes            The pregnancy intention screening data noted above was reviewed. Potential methods of contraception were discussed. The patient elected to proceed with Nexplanon.   Edinburgh Postnatal Depression Scale - 06/02/22 0939       Edinburgh Postnatal Depression Scale:  In the Past 7 Days   I have been able to laugh and see the funny side of things. 0    I have looked forward with enjoyment to things. 0    I have blamed myself unnecessarily when things went wrong. 0    I have been anxious or worried for no good reason. 0    I have felt scared or panicky for no good reason. 0    Things have been getting on top of me. 0    I have been so unhappy that I have had difficulty sleeping. 0    I have felt sad or miserable. 0    I have been so unhappy that I have been crying. 0    The thought of harming myself has occurred to me. 0    Edinburgh Postnatal Depression Scale Total 0             Health Maintenance Due  Topic Date Due   COVID-19 Vaccine (1) Never done   FOOT EXAM  Never done   OPHTHALMOLOGY EXAM  Never done    URINE MICROALBUMIN  Never done   HPV VACCINES (1 - 2-dose series) Never done   HEMOGLOBIN A1C  04/10/2022    The following portions of the patient's history were reviewed and updated as appropriate: allergies, current medications, past family history, past medical history, past social history, past surgical history, and problem list.  Review of Systems Constitutional: negative Eyes: negative Ears, nose, mouth, throat, and face: negative Respiratory: negative Cardiovascular: negative Gastrointestinal: negative Genitourinary:negative Integument/breast: negative Hematologic/lymphatic: negative Musculoskeletal:negative Neurological: negative Behavioral/Psych: negative Endocrine: negative Allergic/Immunologic: negative  Objective:  BP 126/90   Pulse 89   Ht 5\' 6"  (1.676 m)   Wt 233 lb 12.8 oz (106.1 kg)   LMP 05/28/2022 (Exact Date)   BMI 37.74 kg/m    General:  alert, cooperative, and no distress   Breasts:  not indicated  Lungs: Normal effort  Heart:  Not done  Abdomen: soft, non-tender; bowel sounds normal; no masses,  no organomegaly   Wound N/A  GU exam:  not indicated       Assessment:  Encounter for postpartum visit  Hx of gestational diabetes mellitus, not currently pregnant  - Glucose tolerance, 2 hours  Encounter for initial prescription of  implantable subdermal contraceptive  - POCT urine pregnancy > Negative - etonogestrel (NEXPLANON) implant 68 mg  Nexplanon Lot# [id number] Exp: 04/07/2024   Plan:   Essential components of care per ACOG recommendations:  1.  Mood and well being: Patient with negative depression screening today. Reviewed local resources for support.  - Patient tobacco use? No.   - hx of drug use? No.    2. Infant care and feeding:  -Patient currently breastmilk feeding? No.  -Social determinants of health (SDOH) reviewed in EPIC. No concerns  3. Sexuality, contraception and birth spacing - Patient does not want a pregnancy in the  next year.  Desired family size is 1 children; for now.  - Reviewed reproductive life planning. Reviewed contraceptive methods based on pt preferences and effectiveness.  Patient desired Hormonal Implant today.   - Discussed birth spacing of 18 months  4. Sleep and fatigue -Encouraged family/partner/community support of 4 hrs of uninterrupted sleep to help with mood and fatigue  5. Physical Recovery  - Discussed patients delivery and complications. She describes her labor as good. - Patient had a Vaginal, no problems at delivery. Patient had  no  laceration.  - Patient has urinary incontinence? No. - Patient is safe to resume physical and sexual activity  6.  Health Maintenance - HM due items addressed Yes - Last pap smear  Diagnosis  Date Value Ref Range Status  10/10/2021 (A)  Final   - Atypical squamous cells of undetermined significance (ASC-US)   Pap smear not done at today's visit. Will need repeat in 09/2022>to be scheduled prior to end of today's visit -Breast Cancer screening indicated? No.   7. Chronic Disease/Pregnancy Condition follow up: None  - PCP follow up  Raelyn Mora, CNM [facility] [employer], [facility]

## 2022-06-02 NOTE — Patient Instructions (Signed)
Nexplanon Instructions After Insertion   Keep bandage clean and dry for 24 hours   Keep the steri-strips on your arm until they fall off. This usually takes 1-2 wks. If they fall off before 1 week, replace them with the ones we provided you.   May use ice/Tylenol/Ibuprofen for soreness or pain   If you develop fever, drainage or increased warmth from incision site-contact office immediately   

## 2022-06-03 LAB — GLUCOSE TOLERANCE, 2 HOURS
Glucose, 2 hour: 80 mg/dL (ref 70–139)
Glucose, GTT - Fasting: 83 mg/dL (ref 70–99)

## 2022-07-03 ENCOUNTER — Telehealth: Payer: Self-pay | Admitting: *Deleted

## 2022-07-03 NOTE — Telephone Encounter (Signed)
Pt called to office LM about bleeding with Nexplanon.  Attempt to return call.  No answer, no VM.

## 2022-08-05 IMAGING — US US OB LIMITED
1 series · 3 of 3 positions shown · non-contrast
Comparison: none

[Series 1: us ob limited · 3 of 3 slices shown]
[im 1/3]
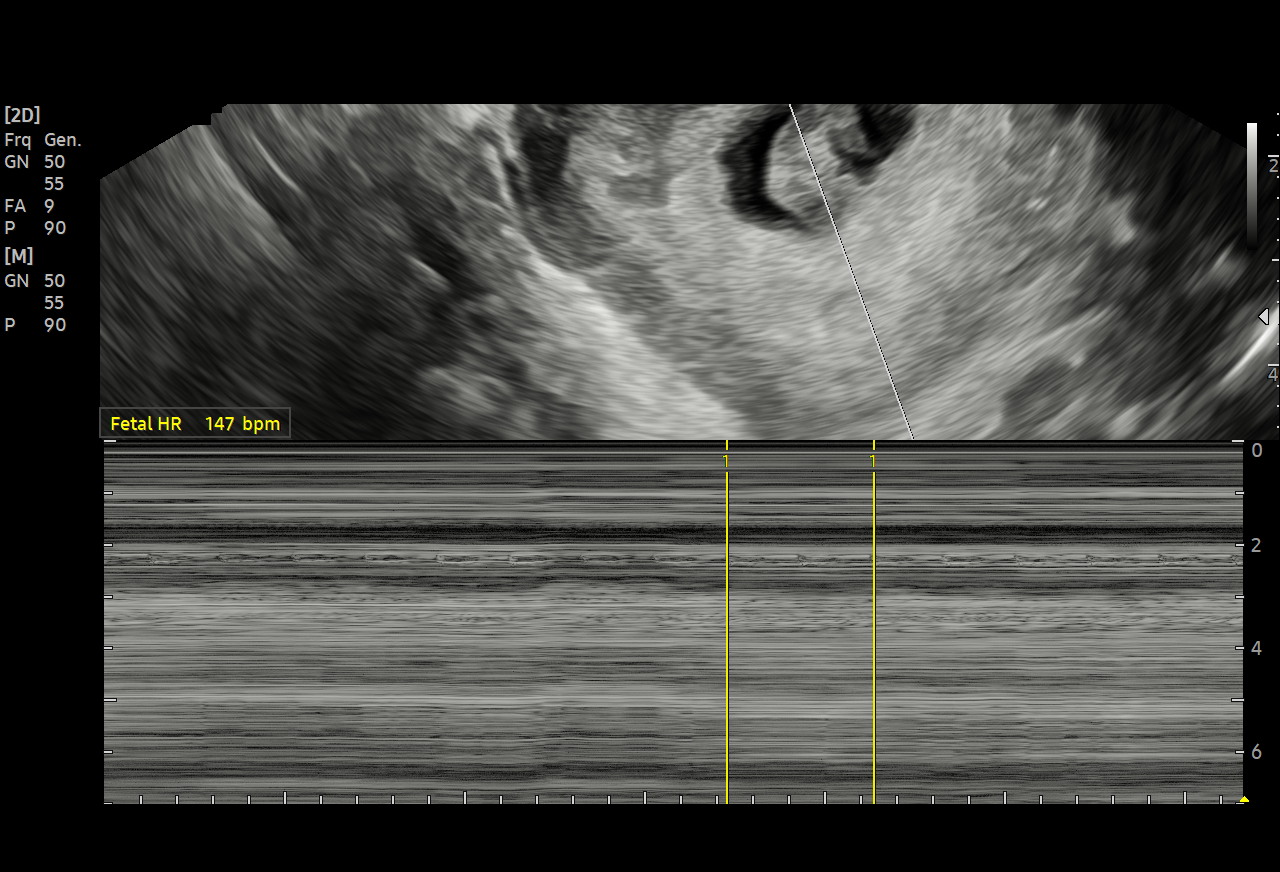
[im 2/3]
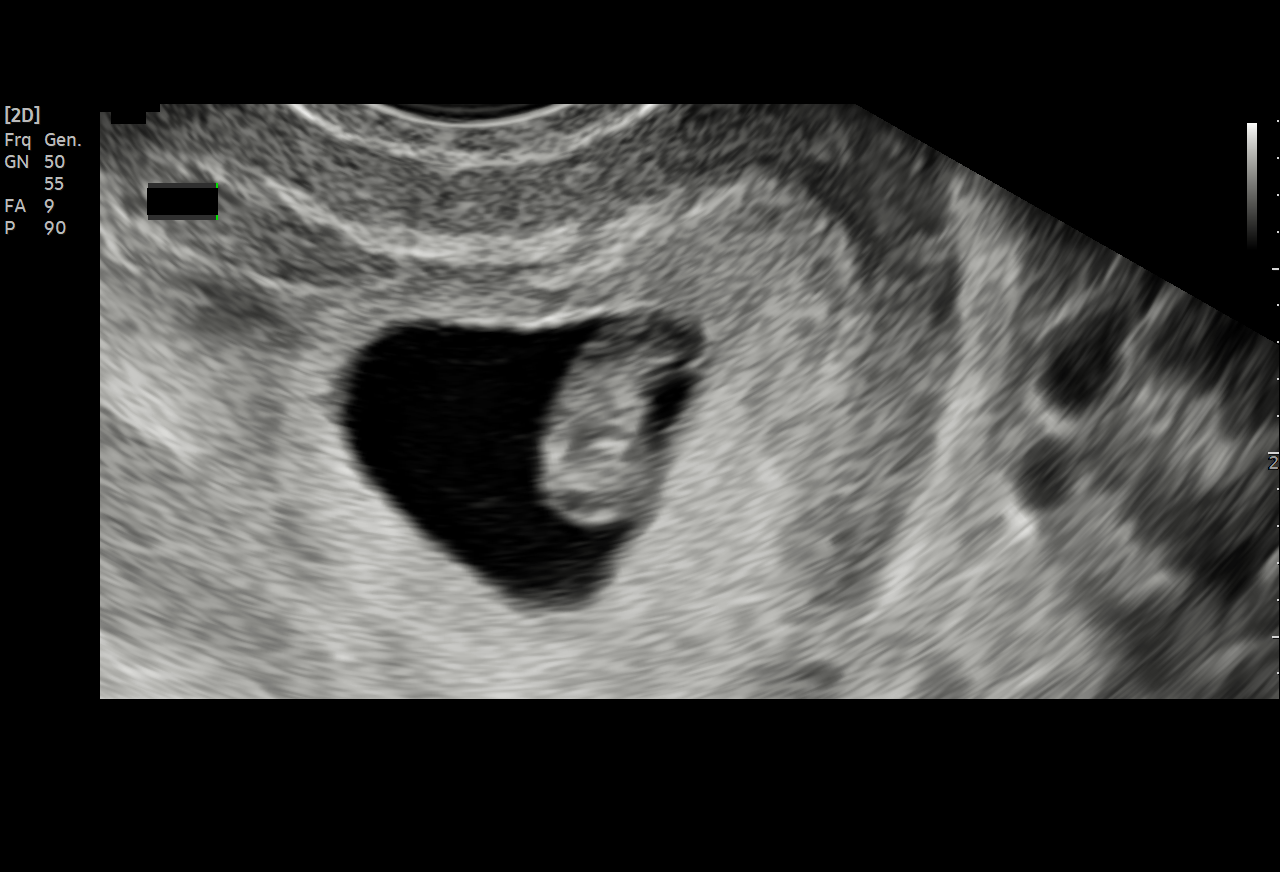
[im 3/3]
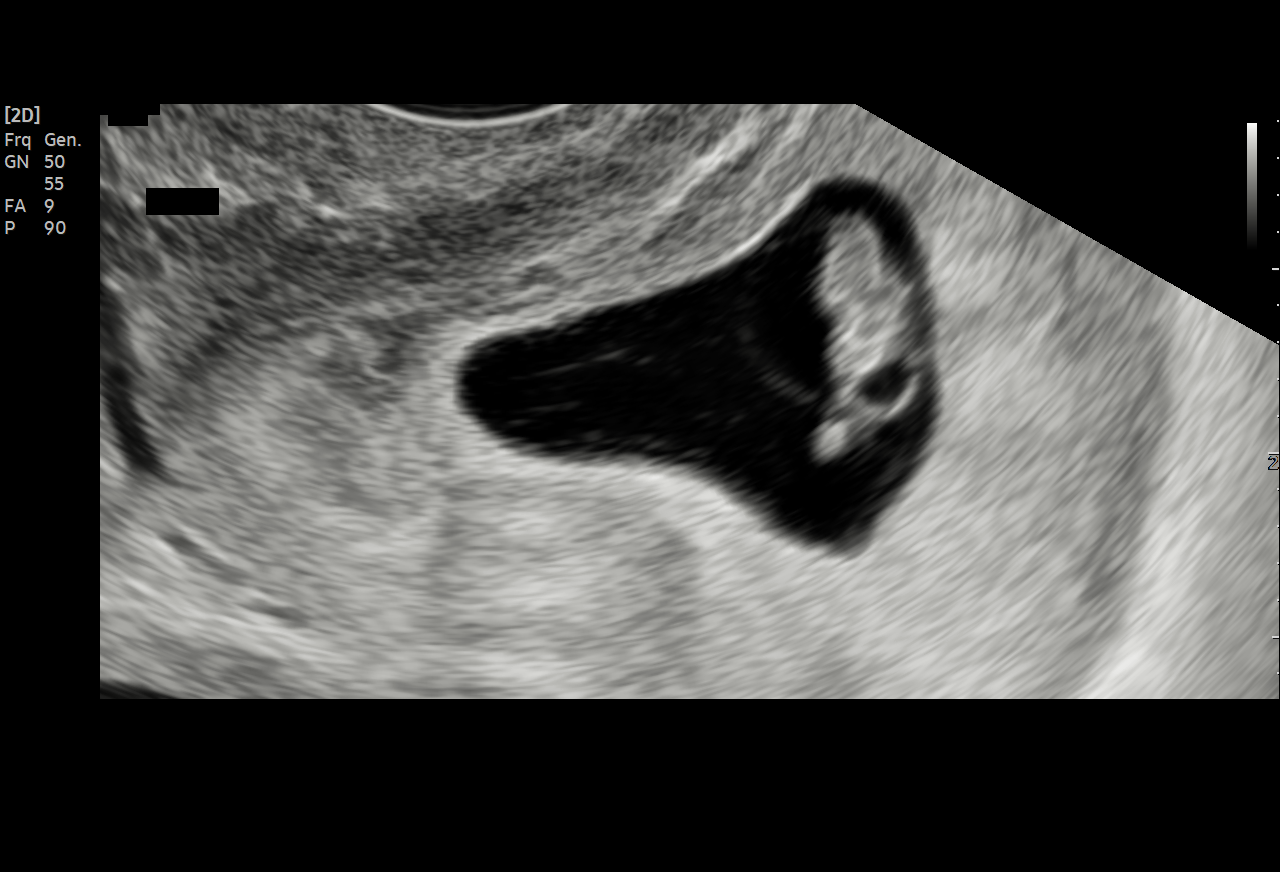

[3 of 3 positions shown; findings below may reference images not displayed]

[REDACTED]care

 1  [HOSPITAL]                         [address].0     MUFACHIR GOAN [facility]

Indications

 Less than 8 weeks gestation of pregnancy
Fetal Evaluation

 Num Of Fetuses:         1
 Fetal Heart Rate(bpm):  147
 Cardiac Activity:       Observed
Gestational Age

 Best:          7w 1d      Det. By:  U/S C R L  (08/26/21)    EDD:   04/23/22
Comments

 Patient presents for reassessment of fetal heart rate.
 Technically limited exam due to early Gestational Age.
Impression

 IUP 7 weeks 1 day with normal FHM
Recommendations

 Fetal anatomy scan at 18-20 weeks.

## 2022-12-12 ENCOUNTER — Other Ambulatory Visit: Payer: Self-pay | Admitting: Emergency Medicine

## 2022-12-12 ENCOUNTER — Encounter: Payer: Self-pay | Admitting: Emergency Medicine

## 2022-12-12 DIAGNOSIS — N898 Other specified noninflammatory disorders of vagina: Secondary | ICD-10-CM

## 2022-12-12 MED ORDER — FLUCONAZOLE 150 MG PO TABS: 150.0000 mg | ORAL_TABLET | Freq: Once | ORAL | 0 refills | Status: AC

## 2022-12-12 NOTE — Progress Notes (Signed)
Rx for yeast, post abx course.

## 2023-02-24 IMAGING — US US MFM OB FOLLOW-UP
1 series · 15 of 22 positions shown · non-contrast
Comparison: none

[Series 1: us mfm ob follow-up · 22 acquisitions, 15 frames shown]
[im 1/22]
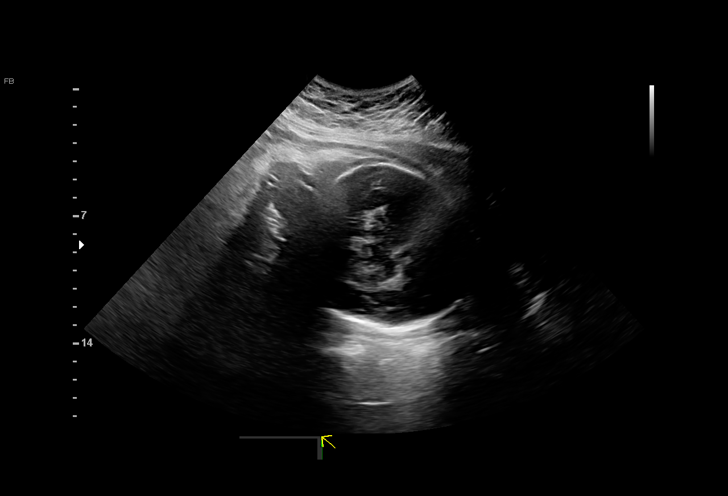
[im 3/22]
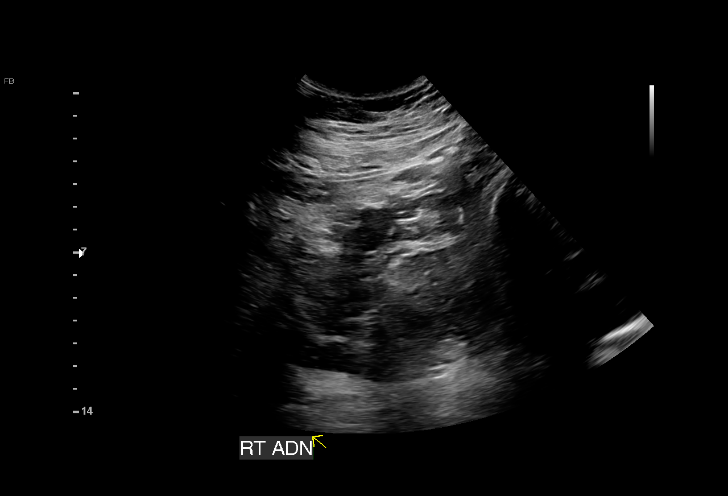
[im 4/22]
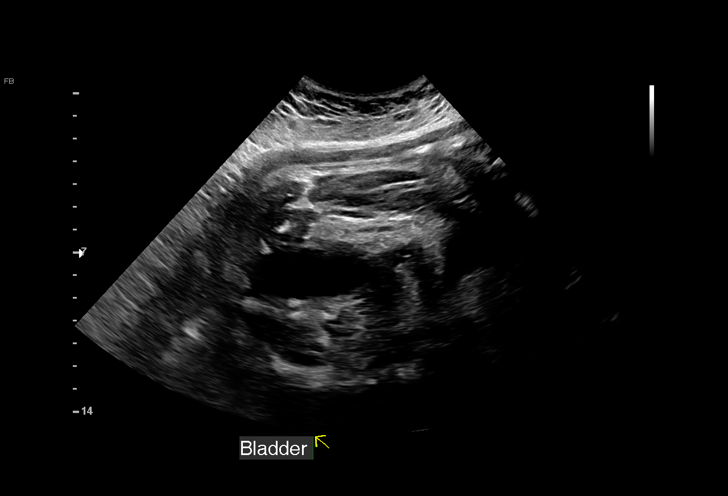
[im 6/22]
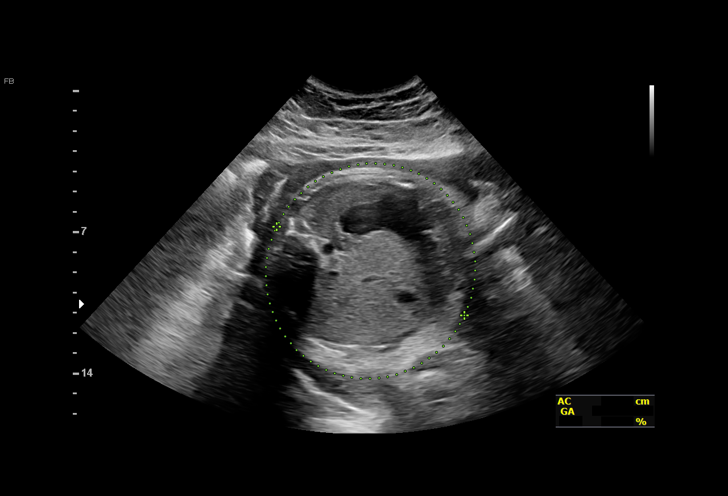
[im 7/22]
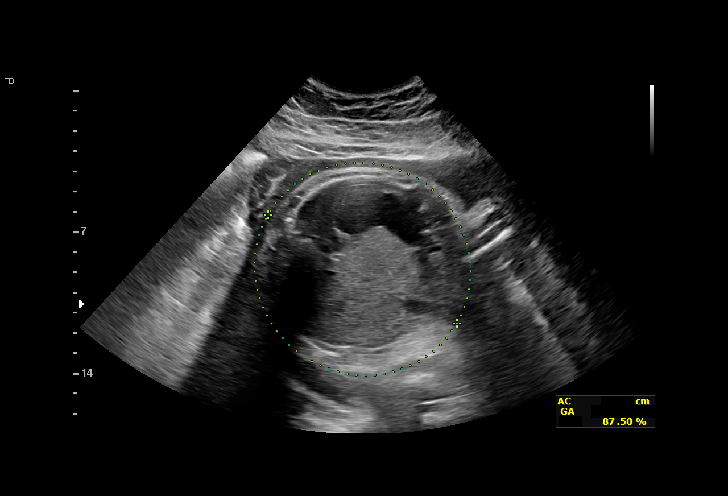
[im 9/22]
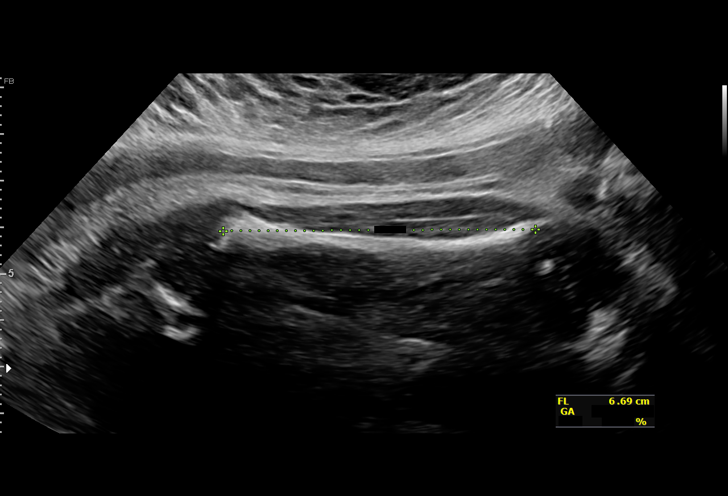
[im 10/22]
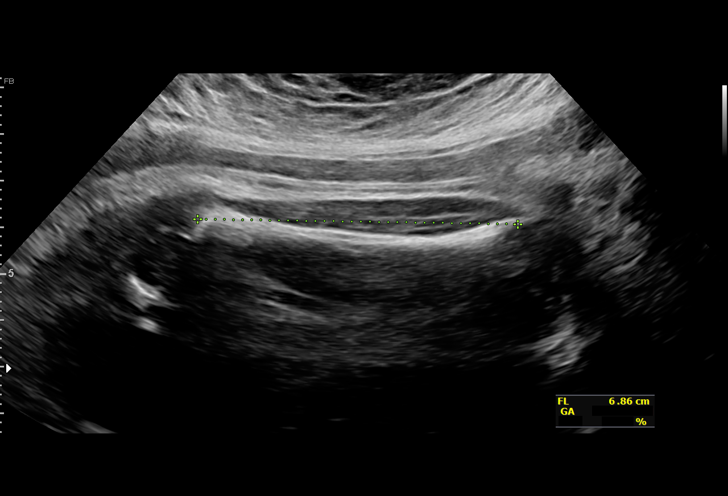
[im 12/22]
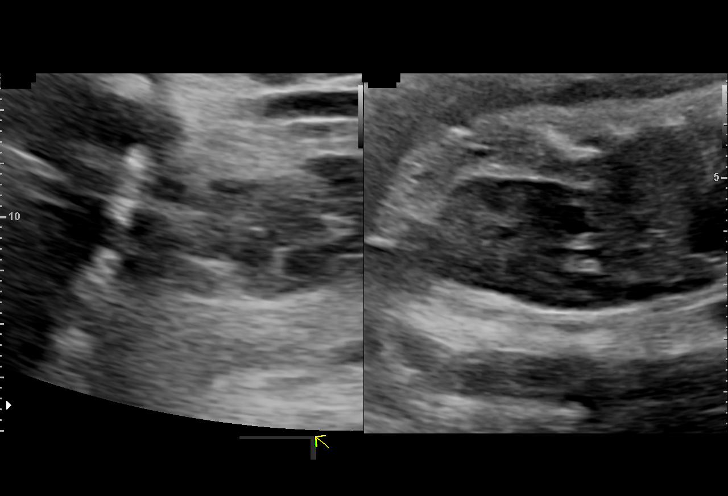
[im 13/22]
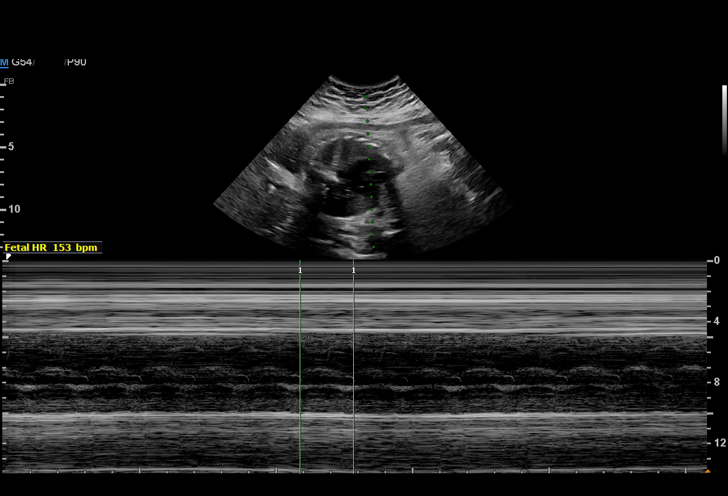
[im 14/22]
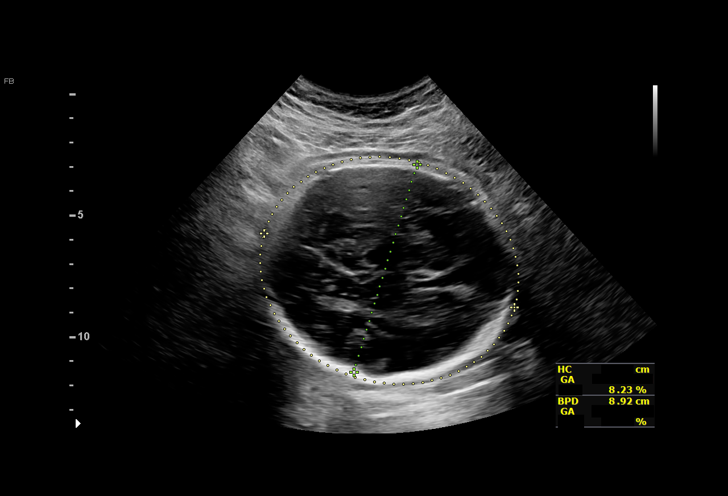
[im 16/22]
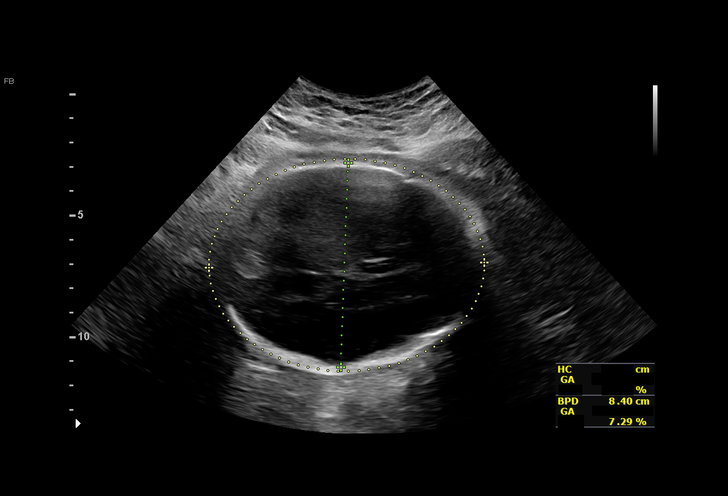
[im 17/22]
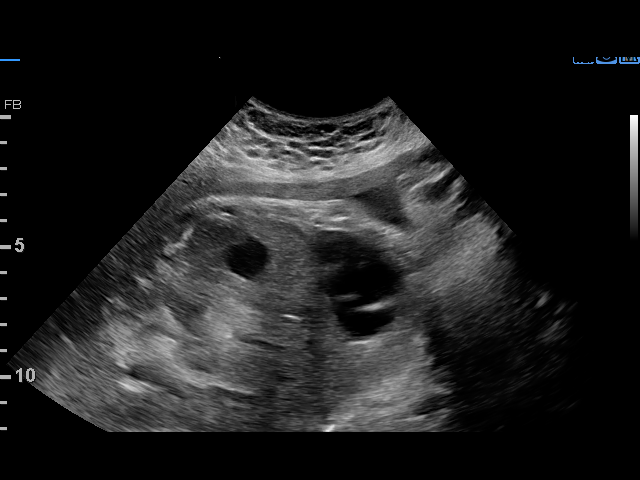
[im 19/22]
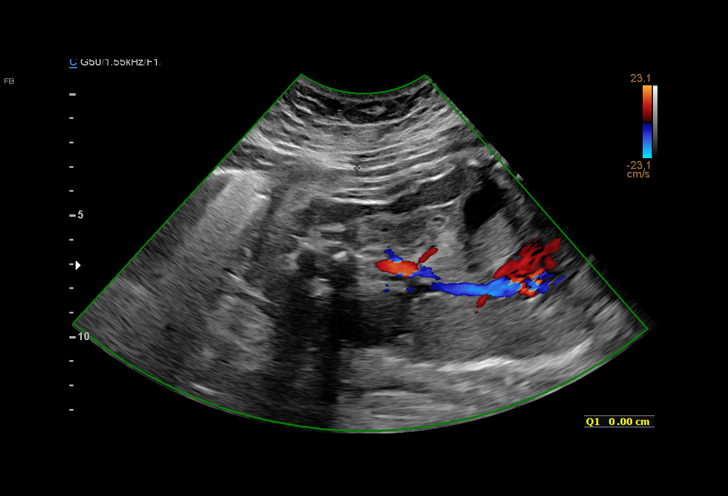
[im 20/22]
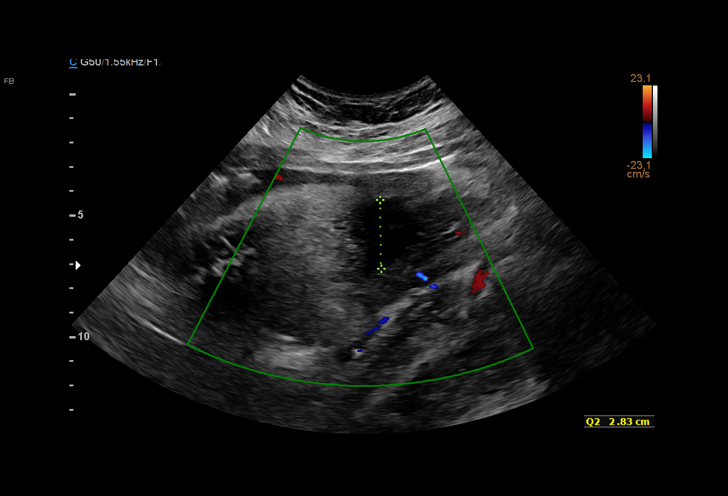
[im 22/22]
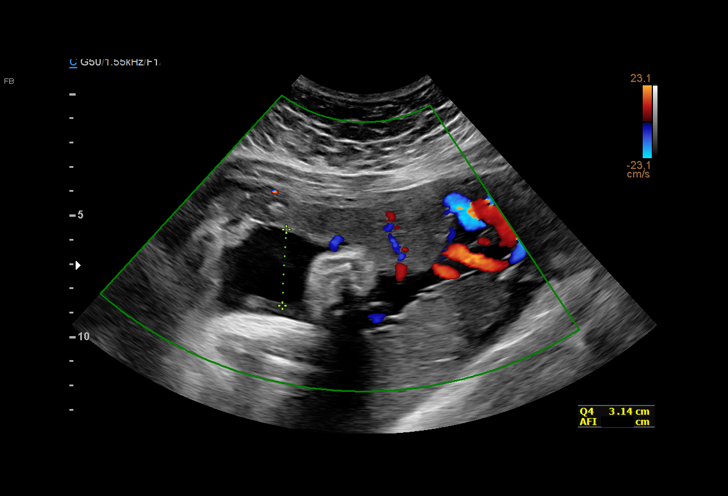

[15 of 22 positions shown; findings below may reference images not displayed]

TANON

Indications

 Gestational diabetes in pregnancy, diet
 controlled
 Obesity complicating pregnancy, third
 trimester (BMI 39)
 Echogenic intracardiac focus of the heart
 (EIF)
 36 weeks gestation of pregnancy
 LR NIPS, Neg Horizon
Fetal Evaluation

 Num Of Fetuses:         1
 Fetal Heart Rate(bpm):  153
 Cardiac Activity:       Observed
 Presentation:           Cephalic
 Placenta:               Anterior
 P. Cord Insertion:      Previously Visualized

 Amniotic Fluid
 AFI FV:      Within normal limits

 AFI Sum(cm)     %Tile       Largest Pocket(cm)
 10.2            24

 RUQ(cm)       RLQ(cm)       LUQ(cm)        LLQ(cm)
 0
Biometry
 BPD:      86.2  mm     G. Age:  34w 5d         21  %    CI:        75.54   %    70 - 86
                                                         FL/HC:      21.5   %    20.1 -
 HC:      314.5  mm     G. Age:  35w 2d          8  %    HC/AC:      0.95        0.93 -
 AC:       331   mm     G. Age:  37w 0d         82  %    FL/BPD:     78.3   %    71 - 87
 FL:       67.5  mm     G. Age:  34w 5d         13  %    FL/AC:      20.4   %    20 - 24

 Est. FW:    1214  gm      6 lb 4 oz     47  %
OB History

 Gravidity:    1
 Living:       0
Gestational Age

 LMP:           38w 4d        Date:  06/30/21                   EDD:   04/06/22
 U/S Today:     35w 3d                                        EDD:   04/28/22
 Best:          36w 1d     Det. By:  U/S C R L  (08/26/21)    EDD:   04/23/22
Anatomy

 Stomach:               Appears normal, left   Bladder:                Appears normal
                        sided
 Kidneys:               Appear normal
Cervix Uterus Adnexa

 Adnexa
 No abnormality visualized.
Impression

 Fetal growth is appropriate for gestational age .Amniotic fluid
 is normal and good fetal activity is seen.

 Gestational diabetes. Well-controlled on diet .
Recommendations

 Follow-up scans as clinically indicated.
                Mad[place]al, [place]
